# Patient Record
Sex: Female | Born: 1995 | Race: Black or African American | Hispanic: No | Marital: Single | Smoking: Never smoker
Health system: Southern US, Community
[De-identification: ages and names within clinical notes are randomized; demographics above are authoritative.]

## PROBLEM LIST (undated history)

## (undated) DIAGNOSIS — J45909 Unspecified asthma, uncomplicated: Secondary | ICD-10-CM

## (undated) DIAGNOSIS — J302 Other seasonal allergic rhinitis: Secondary | ICD-10-CM

## (undated) DIAGNOSIS — J189 Pneumonia, unspecified organism: Secondary | ICD-10-CM

---
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
## 2014-08-14 ENCOUNTER — Encounter (HOSPITAL_COMMUNITY): Payer: Self-pay | Admitting: Emergency Medicine

## 2014-08-14 ENCOUNTER — Inpatient Hospital Stay (HOSPITAL_COMMUNITY)
Admission: EM | Admit: 2014-08-14 | Discharge: 2014-08-17 | DRG: 194 | Disposition: A | Discharge status: Home / Self Care | Payer: Medicaid Other | Attending: Internal Medicine | Admitting: Internal Medicine

## 2014-08-14 ENCOUNTER — Emergency Department (HOSPITAL_COMMUNITY): Payer: Medicaid Other

## 2014-08-14 DIAGNOSIS — R197 Diarrhea, unspecified: Secondary | ICD-10-CM

## 2014-08-14 DIAGNOSIS — J189 Pneumonia, unspecified organism: Principal | ICD-10-CM | POA: Diagnosis present

## 2014-08-14 DIAGNOSIS — J45901 Unspecified asthma with (acute) exacerbation: Secondary | ICD-10-CM | POA: Diagnosis present

## 2014-08-14 DIAGNOSIS — J45902 Unspecified asthma with status asthmaticus: Secondary | ICD-10-CM | POA: Insufficient documentation

## 2014-08-14 DIAGNOSIS — R062 Wheezing: Secondary | ICD-10-CM | POA: Diagnosis not present

## 2014-08-14 DIAGNOSIS — J302 Other seasonal allergic rhinitis: Secondary | ICD-10-CM | POA: Diagnosis present

## 2014-08-14 DIAGNOSIS — J45909 Unspecified asthma, uncomplicated: Secondary | ICD-10-CM | POA: Insufficient documentation

## 2014-08-14 DIAGNOSIS — A084 Viral intestinal infection, unspecified: Secondary | ICD-10-CM | POA: Diagnosis present

## 2014-08-14 DIAGNOSIS — F1290 Cannabis use, unspecified, uncomplicated: Secondary | ICD-10-CM | POA: Diagnosis present

## 2014-08-14 DIAGNOSIS — J4542 Moderate persistent asthma with status asthmaticus: Secondary | ICD-10-CM

## 2014-08-14 DIAGNOSIS — J4531 Mild persistent asthma with (acute) exacerbation: Secondary | ICD-10-CM

## 2014-08-14 DIAGNOSIS — R112 Nausea with vomiting, unspecified: Secondary | ICD-10-CM | POA: Diagnosis present

## 2014-08-14 HISTORY — DX: Other seasonal allergic rhinitis: J30.2

## 2014-08-14 LAB — CBC
HCT: 45.9 % (ref 36.0–46.0)
HEMOGLOBIN: 15.5 g/dL — AB (ref 12.0–15.0)
MCH: 25.4 pg — AB (ref 26.0–34.0)
MCHC: 33.8 g/dL (ref 30.0–36.0)
MCV: 75.1 fL — ABNORMAL LOW (ref 78.0–100.0)
Platelets: 362 10*3/uL (ref 150–400)
RBC: 6.11 MIL/uL — AB (ref 3.87–5.11)
RDW: 14.2 % (ref 11.5–15.5)
WBC: 14.2 10*3/uL — AB (ref 4.0–10.5)

## 2014-08-14 LAB — I-STAT CHEM 8, ED
BUN: 14 mg/dL (ref 6–23)
CREATININE: 0.8 mg/dL (ref 0.50–1.10)
Calcium, Ion: 1.26 mmol/L — ABNORMAL HIGH (ref 1.12–1.23)
Chloride: 107 mEq/L (ref 96–112)
Glucose, Bld: 101 mg/dL — ABNORMAL HIGH (ref 70–99)
HEMATOCRIT: 53 % — AB (ref 36.0–46.0)
HEMOGLOBIN: 18 g/dL — AB (ref 12.0–15.0)
POTASSIUM: 4.5 meq/L (ref 3.7–5.3)
SODIUM: 142 meq/L (ref 137–147)
TCO2: 17 mmol/L (ref 0–100)

## 2014-08-14 MED ORDER — ALBUTEROL (5 MG/ML) CONTINUOUS INHALATION SOLN
15.0000 mg/h | INHALATION_SOLUTION | Freq: Once | RESPIRATORY_TRACT | Status: AC
  Administered 2014-08-14: 15 mg/h via RESPIRATORY_TRACT
  Filled 2014-08-14: qty 20

## 2014-08-14 MED ORDER — PREDNISONE 20 MG PO TABS
40.0000 mg | ORAL_TABLET | Freq: Once | ORAL | Status: AC
  Administered 2014-08-14: 40 mg via ORAL
  Filled 2014-08-14: qty 2

## 2014-08-14 MED ORDER — DEXTROSE 5 % IV SOLN
1.0000 g | Freq: Once | INTRAVENOUS | Status: AC
  Administered 2014-08-14: 1 g via INTRAVENOUS
  Filled 2014-08-14: qty 10

## 2014-08-14 MED ORDER — IPRATROPIUM-ALBUTEROL 0.5-2.5 (3) MG/3ML IN SOLN
3.0000 mL | Freq: Once | RESPIRATORY_TRACT | Status: AC
  Administered 2014-08-14: 3 mL via RESPIRATORY_TRACT
  Filled 2014-08-14: qty 3

## 2014-08-14 MED ORDER — DEXTROSE 5 % IV SOLN
500.0000 mg | Freq: Once | INTRAVENOUS | Status: AC
  Administered 2014-08-15: 500 mg via INTRAVENOUS
  Filled 2014-08-14: qty 500

## 2014-08-14 NOTE — ED Notes (Signed)
Pt presents with difficulty breathing, short sentences, accessory muscle usage noted, pt states she has used her MDI "a lot" today, out of medication for nebulizer.

## 2014-08-14 NOTE — ED Provider Notes (Signed)
CSN: [id number]     Arrival date & time 08/14/14  2123 History   First MD Initiated Contact with Patient 08/14/14 2140     Chief Complaint  Patient presents with  . Asthma     (Consider location/radiation/quality/duration/timing/severity/associated sxs/prior Treatment) HPI Patient developed wheezing gradual in onset progressively worsening onset 2 days ago accompanied by cough productive of yellow sputum. She denies fever she admits to vomiting 2 or 3 times. one time was posttussive denies nausea presently denies pain anywhere. She ran out of her albuterol inhaler and nebulizer solution. No treatment prior to coming here. She feels slightly improved since treatment with a DuoNeb prior to my exam Past Medical History  Diagnosis Date  . Asthma   . Seasonal allergies    no history of intubation. Positive history of steroid use, one ED visit in the past year for asthma History reviewed. No pertinent past surgical history. No family history on file. History  Substance Use Topics  . Smoking status: Never Smoker   . Smokeless tobacco: Never Used  . Alcohol Use: Yes   positive smoker positive marijuana use no alcohol use OB History    No data available     Review of Systems  Constitutional: Negative.   HENT: Negative.   Respiratory: Positive for cough, shortness of breath and wheezing.   Cardiovascular: Negative.   Gastrointestinal: Positive for vomiting.  Musculoskeletal: Negative.   Skin: Negative.   Neurological: Negative.   Psychiatric/Behavioral: Negative.   All other systems reviewed and are negative.     Allergies  Review of patient's allergies indicates no known allergies.  Home Medications   Prior to Admission medications   Medication Sig Start Date End Date Taking? Authorizing Provider  albuterol (PROVENTIL HFA;VENTOLIN HFA) 108 (90 BASE) MCG/ACT inhaler Inhale 2 puffs into the lungs every 6 (six) hours as needed for wheezing. 02/25/13   Osvaldo ShipperGokul Krishnan, MD   albuterol (PROVENTIL) (2.5 MG/3ML) 0.083% nebulizer solution Take 3 mLs (2.5 mg total) by nebulization every 6 (six) hours as needed for wheezing. 02/25/13   Osvaldo ShipperGokul Krishnan, MD  medroxyPROGESTERone (DEPO-PROVERA) 150 MG/ML injection Inject 150 mg into the muscle every 3 (three) months.    Historical Provider, MD  montelukast (SINGULAIR) 10 MG tablet Take 10 mg by mouth at bedtime.    Historical Provider, MD  PRESCRIPTION MEDICATION Take 1 tablet by mouth daily. Allergy medication    Historical Provider, MD   BP 130/73 mmHg  Pulse 135  Temp(Src) 98.3 F (36.8 C) (Oral)  Resp 20  Ht 5\' 3"  (1.6 m)  Wt 127 lb (57.607 kg)  BMI 22.50 kg/m2  SpO2 88%  LMP 08/05/2014 Physical Exam  Constitutional: She appears well-developed and well-nourished. No distress.  HENT:  Head: Normocephalic and atraumatic.  Eyes: Conjunctivae are normal. Pupils are equal, round, and reactive to light.  Neck: Neck supple. No tracheal deviation present. No thyromegaly present.  Cardiovascular: Normal rate.   No murmur heard. Tachycardic  Pulmonary/Chest: She has no wheezes.  Speaks in paragraphs prolonged history phase with expiratory wheezes  Abdominal: Soft. Bowel sounds are normal. She exhibits no distension. There is no tenderness.  Musculoskeletal: Normal range of motion. She exhibits no edema or tenderness.  Neurological: She is alert. Coordination normal.  Skin: Skin is warm and dry. No rash noted.  Psychiatric: She has a normal mood and affect.  Nursing note and vitals reviewed.   ED Course  Procedures (including critical care time) Labs Review Labs Reviewed  CBC  I-STAT  CHEM 8, ED    Imaging Review No results found.   EKG Interpretation None     11:25 PM patient's breathing is not at baseline after one hour of continuous nebulization. On reexamination she speaks in paragraphs lungs with extra wheezes and prolonged expert phase Chest x-ray viewed by me Pulse oximetry on room air 88%  consistent with hypoxia MDM  In light of productive cough, leukocytosis we'll treat for community-acquired pneumonia Plan IV antibiotics, additional nebulized treatments. Supplemental oxygen . Spoke with Dr.Niu. Plan 23 hour observation telemetry Diagnosis #1 status asthmaticus #2 community acquired pneumonia #3 hypoxia CRITICAL CARE Performed by: Doug SouJACUBOWITZ,Annah Jasko Total critical care time: 30 minute Critical care time was exclusive of separately billable procedures and treating other patients. Critical care was necessary to treat or prevent imminent or life-threatening deterioration. Critical care was time spent personally by me on the following activities: development of treatment plan with patient and/or surrogate as well as nursing, discussions with consultants, evaluation of patient's response to treatment, examination of patient, obtaining history from patient or surrogate, ordering and performing treatments and interventions, ordering and review of laboratory studies, ordering and review of radiographic studies, pulse oximetry and re-evaluation of patient's condition. Final diagnoses:  Wheeze        Doug SouSam Olia Hinderliter, MD 08/14/14 [phone]

## 2014-08-14 NOTE — H&P (Signed)
[facility] History and Physical  Carita Tamera Reasonan Luster ZOX:[id number] DOB: 04/28/1996 DOA: 08/14/2014  Referring physician: ED physician PCP: [facility]  Specialists:   Chief Complaint: Cough, shortness of breath, and mild nausea, vomiting and diarrhea  HPI: Danielle Carlson is a 18 y.o. female with past medical history of asthma, who presents with cough, shortness of breath, and mild nausea, vomiting and diarrhea  Patient reports that in the past 3 days, she has been having cough, shortness of breath and white-yellow colored sputum production. No fever, chills. Mild chest pain which is related to coughing. No cold symptoms, such as runny nose, sore throat.   Patient also reports mild nausea, vomiting and diarrhea, no abdominal pain in the past 2 days. She denies fever, chills, chest pain, dysuria, urgency, frequency, hematuria, skin rashes, joint pain or leg swelling.  Work up in the ED demonstrates leukocytosis with a WBC 14.2. Chest x-ray showed a right basilar infiltration. Patient is admitted to inpatient for further evaluation and treatment.  Review of Systems: As presented in the history of presenting illness, rest negative.  Where does patient live?  At home Can patient participate in ADLs? Yes  Allergy: No Known Allergies  Past Medical History  Diagnosis Date  . Asthma   . Seasonal allergies     History reviewed. No pertinent past surgical history.  Social History:  reports that she has never smoked. She has never used smokeless tobacco. She reports that she drinks alcohol. She reports that she uses illicit drugs (Marijuana).  Family History: No family history on file.   Prior to Admission medications   Medication Sig Start Date End Date Taking? Authorizing Provider  acetaminophen (TYLENOL) 325 MG tablet Take 650 mg by mouth every 6 (six) hours as needed for moderate pain.   Yes Historical Provider, MD  albuterol (PROVENTIL HFA;VENTOLIN HFA)  108 (90 BASE) MCG/ACT inhaler Inhale 2 puffs into the lungs every 6 (six) hours as needed for wheezing. 02/25/13  Yes Osvaldo ShipperGokul Krishnan, MD  albuterol (PROVENTIL) (2.5 MG/3ML) 0.083% nebulizer solution Take 3 mLs (2.5 mg total) by nebulization every 6 (six) hours as needed for wheezing. 02/25/13  Yes Osvaldo ShipperGokul Krishnan, MD  diphenhydrAMINE (BENADRYL) 12.5 MG/5ML elixir Take 25 mg by mouth 4 (four) times daily as needed for allergies.   Yes Historical Provider, MD  montelukast (SINGULAIR) 10 MG tablet Take 10 mg by mouth at bedtime.   Yes Historical Provider, MD    Physical Exam: Filed Vitals:   08/14/14 2334 08/14/14 2345 08/15/14 0000 08/15/14 0520  BP:  126/86 128/83 120/85  Pulse:  136 116 122  Temp:   98.7 F (37.1 C) 98.1 F (36.7 C)  TempSrc:   Oral Oral  Resp:   20 20  Height:   5\' 4"  (1.626 m)   Weight:   51.891 kg (114 lb 6.4 oz)   SpO2: 93% 92% 94% 93%   General: Not in acute distress HEENT:       Eyes: PERRL, EOMI, no scleral icterus       ENT: No discharge from the ears and nose, no pharynx injection, no tonsillar enlargement.        Neck: No JVD, no bruit, no mass felt. Cardiac: S1/S2, RRR, No murmurs, No gallops or rubs Pulm: Bilateral wheezing posteriorly Abd: Soft, nondistended, nontender, no rebound pain, no organomegaly, BS present Ext: No edema bilaterally. 2+DP/PT pulse bilaterally Musculoskeletal: No joint deformities, erythema, or stiffness, ROM full Skin: No rashes.  Neuro: Alert and oriented X3, cranial nerves II-XII grossly intact, muscle strength 5/5 in all extremeties, sensation to light touch intact.  Psych: Patient is not psychotic, no suicidal or hemocidal ideation.  Labs on Admission:  Basic Metabolic Panel:  Recent Labs Lab 08/14/14 2209 08/15/14 0130  NA 142 138  K 4.5 4.1  CL 107 102  CO2  --  14*  GLUCOSE 101* 112*  BUN 14 11  CREATININE 0.80 0.78  CALCIUM  --  9.5   Liver Function Tests:  Recent Labs Lab 08/15/14 0130  AST 27  ALT 13   ALKPHOS 56  BILITOT 0.9  PROT 7.7  ALBUMIN 3.8   No results for input(s): LIPASE, AMYLASE in the last 168 hours. No results for input(s): AMMONIA in the last 168 hours. CBC:  Recent Labs Lab 08/14/14 2156 08/14/14 2209  WBC 14.2*  --   HGB 15.5* 18.0*  HCT 45.9 53.0*  MCV 75.1*  --   PLT 362  --    Cardiac Enzymes: No results for input(s): CKTOTAL, CKMB, CKMBINDEX, TROPONINI in the last 168 hours.  BNP (last 3 results) No results for input(s): PROBNP in the last 8760 hours. CBG: No results for input(s): GLUCAP in the last 168 hours.  Radiological Exams on Admission: Dg Chest Port 1 View  08/14/2014   CLINICAL DATA:  Acute onset of wheezing, cough and congestion for 2-3 days. Shortness of breath, nausea and vomiting. Current history of smoking. Initial encounter.  EXAM: PORTABLE CHEST - 1 VIEW  COMPARISON:  Chest radiograph performed 02/23/2013  FINDINGS: The lungs are well-aerated. Mild chronic peribronchial thickening is noted. Minimal right basilar opacity could reflect mild pneumonia. There is no evidence of pleural effusion or pneumothorax.  The cardiomediastinal silhouette is within normal limits. No acute osseous abnormalities are seen.  IMPRESSION: 1. Minimal right basilar opacity could reflect mild pneumonia. 2. Mild chronic peribronchial thickening noted.   Electronically Signed   By: Roanna RaiderJeffery  Chang M.D.   On: 08/14/2014 22:39    EKG: Will get one   Assessment/Plan Principal Problem:   CAP (community acquired pneumonia) Active Problems:   Asthma with acute exacerbation   Seasonal allergies   Nausea vomiting and diarrhea  CAP and asthma exacerbation: Patient's cough, chest pain, leukocytosis are consistent with comminuted acquired pneumonia. Currently the patient is mildly septic with heart rate of 125 and leukocytosis, wbc 14.2. Patient also has asthma exacerbation given bilateral wheezing on auscultation.  - will admitted to Telemetry Bed - blood culture  x2 - urine legionella and S. pneumococcal antigen - treat with levaquin - follow up sputum culture and her respiratory events panel - IVF: 100 cc/h - treat with DuoNeb and albuterol Nebs - prednisone 40 mg daily - give Zofran for Nausea  Mild nausea, vomiting and diarrhea: Patient does not have abdominal pain. It is most likely due to viral gastro-enteritis. -Check GI pathogen panel -IV fluid -Zofran fornausea   DVT ppx: SQ Heparin     Code Status: Full code Family Communication:    Yes, patient's  mother     at bed side Disposition Plan: Admit to inpatient   Date of Service 08/15/2014    Lorretta HarpIU, Makael Stein [facility] Pager [phone]  If 7PM-7AM, please contact night-coverage [email or web address] Password [email or web address] 08/15/2014, 5:57 AM

## 2014-08-15 DIAGNOSIS — R112 Nausea with vomiting, unspecified: Secondary | ICD-10-CM

## 2014-08-15 DIAGNOSIS — R197 Diarrhea, unspecified: Secondary | ICD-10-CM

## 2014-08-15 LAB — COMPREHENSIVE METABOLIC PANEL
ALK PHOS: 56 U/L (ref 39–117)
ALT: 13 U/L (ref 0–35)
ANION GAP: 22 — AB (ref 5–15)
AST: 27 U/L (ref 0–37)
Albumin: 3.8 g/dL (ref 3.5–5.2)
BUN: 11 mg/dL (ref 6–23)
CALCIUM: 9.5 mg/dL (ref 8.4–10.5)
CO2: 14 meq/L — AB (ref 19–32)
Chloride: 102 mEq/L (ref 96–112)
Creatinine, Ser: 0.78 mg/dL (ref 0.50–1.10)
GLUCOSE: 112 mg/dL — AB (ref 70–99)
Potassium: 4.1 mEq/L (ref 3.7–5.3)
Sodium: 138 mEq/L (ref 137–147)
TOTAL PROTEIN: 7.7 g/dL (ref 6.0–8.3)
Total Bilirubin: 0.9 mg/dL (ref 0.3–1.2)

## 2014-08-15 LAB — HIV ANTIBODY (ROUTINE TESTING W REFLEX): HIV: NONREACTIVE

## 2014-08-15 LAB — EXPECTORATED SPUTUM ASSESSMENT W GRAM STAIN, RFLX TO RESP C

## 2014-08-15 LAB — EXPECTORATED SPUTUM ASSESSMENT W REFEX TO RESP CULTURE

## 2014-08-15 LAB — STREP PNEUMONIAE URINARY ANTIGEN: STREP PNEUMO URINARY ANTIGEN: NEGATIVE

## 2014-08-15 MED ORDER — PREDNISONE 20 MG PO TABS
40.0000 mg | ORAL_TABLET | Freq: Every day | ORAL | Status: DC
  Administered 2014-08-15 – 2014-08-17 (×3): 40 mg via ORAL
  Filled 2014-08-15 (×4): qty 2

## 2014-08-15 MED ORDER — CETYLPYRIDINIUM CHLORIDE 0.05 % MT LIQD
7.0000 mL | Freq: Two times a day (BID) | OROMUCOSAL | Status: DC
  Administered 2014-08-15 (×2): 7 mL via OROMUCOSAL

## 2014-08-15 MED ORDER — CETYLPYRIDINIUM CHLORIDE 0.05 % MT LIQD
7.0000 mL | Freq: Two times a day (BID) | OROMUCOSAL | Status: DC
  Administered 2014-08-15 – 2014-08-17 (×6): 7 mL via OROMUCOSAL

## 2014-08-15 MED ORDER — DIPHENHYDRAMINE HCL 12.5 MG/5ML PO ELIX: 25.0000 mg | ORAL_SOLUTION | Freq: Four times a day (QID) | ORAL | Status: DC | PRN

## 2014-08-15 MED ORDER — INFLUENZA VAC SPLIT QUAD 0.5 ML IM SUSY
0.5000 mL | PREFILLED_SYRINGE | INTRAMUSCULAR | Status: AC
  Administered 2014-08-16: 0.5 mL via INTRAMUSCULAR
  Filled 2014-08-15 (×2): qty 0.5

## 2014-08-15 MED ORDER — ACETAMINOPHEN 325 MG PO TABS
650.0000 mg | ORAL_TABLET | Freq: Four times a day (QID) | ORAL | Status: DC | PRN
  Administered 2014-08-15: 650 mg via ORAL
  Filled 2014-08-15 (×2): qty 2

## 2014-08-15 MED ORDER — IPRATROPIUM-ALBUTEROL 0.5-2.5 (3) MG/3ML IN SOLN
3.0000 mL | RESPIRATORY_TRACT | Status: DC
  Administered 2014-08-15 – 2014-08-17 (×14): 3 mL via RESPIRATORY_TRACT
  Filled 2014-08-15 (×14): qty 3

## 2014-08-15 MED ORDER — ONDANSETRON HCL 4 MG/2ML IJ SOLN
4.0000 mg | Freq: Three times a day (TID) | INTRAMUSCULAR | Status: DC | PRN
  Administered 2014-08-15: 4 mg via INTRAVENOUS
  Filled 2014-08-15: qty 2

## 2014-08-15 MED ORDER — ALBUTEROL (5 MG/ML) CONTINUOUS INHALATION SOLN
2.5000 mg/h | INHALATION_SOLUTION | RESPIRATORY_TRACT | Status: DC | PRN
  Filled 2014-08-15: qty 20

## 2014-08-15 MED ORDER — LEVOFLOXACIN IN D5W 500 MG/100ML IV SOLN
500.0000 mg | INTRAVENOUS | Status: DC
  Administered 2014-08-15 – 2014-08-17 (×3): 500 mg via INTRAVENOUS
  Filled 2014-08-15 (×3): qty 100

## 2014-08-15 MED ORDER — CHLORHEXIDINE GLUCONATE 0.12 % MT SOLN
15.0000 mL | Freq: Two times a day (BID) | OROMUCOSAL | Status: DC
  Administered 2014-08-15 – 2014-08-16 (×4): 15 mL via OROMUCOSAL
  Filled 2014-08-15 (×7): qty 15

## 2014-08-15 MED ORDER — HEPARIN SODIUM (PORCINE) 5000 UNIT/ML IJ SOLN
5000.0000 [IU] | Freq: Three times a day (TID) | INTRAMUSCULAR | Status: DC
  Administered 2014-08-15 – 2014-08-17 (×7): 5000 [IU] via SUBCUTANEOUS
  Filled 2014-08-15 (×10): qty 1

## 2014-08-15 MED ORDER — ALBUTEROL SULFATE (2.5 MG/3ML) 0.083% IN NEBU
5.0000 mg | INHALATION_SOLUTION | RESPIRATORY_TRACT | Status: DC | PRN
Start: 2014-08-15 — End: 2014-08-17

## 2014-08-15 MED ORDER — MONTELUKAST SODIUM 10 MG PO TABS
10.0000 mg | ORAL_TABLET | Freq: Every day | ORAL | Status: DC
  Administered 2014-08-15 – 2014-08-16 (×2): 10 mg via ORAL
  Filled 2014-08-15 (×3): qty 1

## 2014-08-15 NOTE — Progress Notes (Signed)
Progress Note   Danielle Carlson   Brief Narrative:   Danielle Carlson is an 18 y.o. female with a PMH of asthma who was admitted 08/14/14 with community-acquired pneumonia and asthma exacerbation.  Assessment/Plan:   Principal Problem:   CAP (community acquired pneumonia)  Continue Levaquin.  Strep pneumonia negative. Follow-up blood/sputum cultures, Legionella antigen, respiratory virus panel and HIV antibody.  Active Problems:   Asthma with acute exacerbation  Continue nebulized bronchodilator therapy, prednisone.    Seasonal allergies  Continue Singulair and as needed Benadryl.    Nausea vomiting and diarrhea  Follow-up GI pathogen panel.    DVT Prophylaxis  Continue subcutaneous heparin.  Code Status: Full. Family Communication: No family at the bedside. Disposition Plan: Home when stable.   IV Access:    Peripheral IV   Procedures and diagnostic studies:   Dg Chest Port 1 View 08/14/2014: 1. Minimal right basilar opacity could reflect mild pneumonia. 2. Mild chronic peribronchial thickening noted.    Medical Consultants:    None.  Anti-Infectives:    Rocephin/Azithromycin x 1 08/14/14  Levaquin 08/14/14--->  Subjective:   Danielle Carlson is beginning to feel a bit better.  Still with cough, white/yellow sputum.  Still dyspneic.    Objective:    Filed Vitals:   08/14/14 2334 08/14/14 2345 08/15/14 0000 08/15/14 0520  BP:  126/86 128/83 120/85  Pulse:  136 116 122  Temp:   98.7 F (37.1 C) 98.1 F (36.7 C)  TempSrc:   Oral Oral  Resp:   20 20  Height:   5\' 4"  (1.626 m)   Weight:   51.891 kg (114 lb 6.4 oz)   SpO2: 93% 92% 94% 93%    Intake/Output Summary (Last 24 hours) at 08/15/14 0740 Last data filed at 08/15/14 0234  Gross per 24 hour  Intake    470 ml  Output      0 ml  Net    470 ml    Exam: Gen:  NAD Cardiovascular:  RRR,  No M/R/G Respiratory:  Lungs with expiratory wheezes Gastrointestinal:  Abdomen soft, NT/ND, + BS Extremities:  No C/E/C   Data Reviewed:    Labs: Basic Metabolic Panel:  Recent Labs Lab 08/14/14 2209 08/15/14 0130  NA 142 138  K 4.5 4.1  CL 107 102  CO2  --  14*  GLUCOSE 101* 112*  BUN 14 11  CREATININE 0.80 0.78  CALCIUM  --  9.5   GFR Estimated Creatinine Clearance: 92.7 mL/min (by C-G formula based on Cr of 0.78). Liver Function Tests:  Recent Labs Lab 08/15/14 0130  AST 27  ALT 13  ALKPHOS 56  BILITOT 0.9  PROT 7.7  ALBUMIN 3.8   CBC:  Recent Labs Lab 08/14/14 2156 08/14/14 2209  WBC 14.2*  --   HGB 15.5* 18.0*  HCT 45.9 53.0*  MCV 75.1*  --   PLT 362  --    Sepsis Labs:  Recent Labs Lab 08/14/14 2156  WBC 14.2*   Microbiology Recent Results (from the past 240 hour(s))  Culture, sputum-assessment     Status: None   Collection Time: 08/15/14  1:50 AM  Result Value Ref Range Status   Specimen Description SPUTUM  Final   Special Requests NONE  Final   Sputum evaluation   Final    MICROSCOPIC FINDINGS SUGGEST THAT THIS SPECIMEN IS NOT REPRESENTATIVE OF LOWER RESPIRATORY  SECRETIONS. PLEASE RECOLLECT. SPOKE WITH [facility] HospitalKALLAM,M RN [phone] [place],N    Report Status 08/15/2014 FINAL  Final     Medications:   . antiseptic oral rinse  7 mL Mouth Rinse BID  . antiseptic oral rinse  7 mL Mouth Rinse q12n4p  . chlorhexidine  15 mL Mouth Rinse BID  . heparin  5,000 Units Subcutaneous 3 times per day  . [START ON 08/16/2014] Influenza vac split quadrivalent PF  0.5 mL Intramuscular Tomorrow-1000  . ipratropium-albuterol  3 mL Nebulization Q4H  . levofloxacin (LEVAQUIN) IV  500 mg Intravenous Q24H  . montelukast  10 mg Oral QHS  . predniSONE  40 mg Oral Q breakfast   Continuous Infusions:   Time spent: 25 minutes.   LOS: 1 day   RAMA,CHRISTINA  [facility] Pager [phone]. If unable to reach me by pager, please call my cell phone  at [phone].  *Please refer to [email or web address], password [email or web address] to get updated schedule on who will round on this patient, as hospitalists switch teams weekly. If 7PM-7AM, please contact night-coverage at [email or web address], password [email or web address] for any overnight needs.  08/15/2014, 7:40 AM

## 2014-08-16 LAB — LEGIONELLA ANTIGEN, URINE

## 2014-08-16 LAB — CBC
HCT: 38.2 % (ref 36.0–46.0)
HEMOGLOBIN: 12.3 g/dL (ref 12.0–15.0)
MCH: 24.4 pg — AB (ref 26.0–34.0)
MCHC: 32.2 g/dL (ref 30.0–36.0)
MCV: 75.6 fL — AB (ref 78.0–100.0)
PLATELETS: 288 10*3/uL (ref 150–400)
RBC: 5.05 MIL/uL (ref 3.87–5.11)
RDW: 14.4 % (ref 11.5–15.5)
WBC: 15.9 10*3/uL — AB (ref 4.0–10.5)

## 2014-08-16 LAB — RESPIRATORY VIRUS PANEL
ADENOVIRUS: NOT DETECTED
Influenza A H1: NOT DETECTED
Influenza A H3: NOT DETECTED
Influenza A: NOT DETECTED
Influenza B: NOT DETECTED
METAPNEUMOVIRUS: NOT DETECTED
PARAINFLUENZA 2 A: NOT DETECTED
Parainfluenza 1: NOT DETECTED
Parainfluenza 3: NOT DETECTED
RESPIRATORY SYNCYTIAL VIRUS B: NOT DETECTED
Respiratory Syncytial Virus A: NOT DETECTED
Rhinovirus: NOT DETECTED

## 2014-08-16 LAB — BASIC METABOLIC PANEL
Anion gap: 10 (ref 5–15)
BUN: 11 mg/dL (ref 6–23)
CHLORIDE: 107 meq/L (ref 96–112)
CO2: 22 mmol/L (ref 19–32)
Calcium: 9.4 mg/dL (ref 8.4–10.5)
Creatinine, Ser: 0.76 mg/dL (ref 0.50–1.10)
GFR calc Af Amer: >90 mL/min (ref >90)
GFR calc non Af Amer: >90 mL/min (ref >90)
GLUCOSE: 91 mg/dL (ref 70–99)
POTASSIUM: 4.3 mmol/L (ref 3.5–5.1)
Sodium: 139 mmol/L (ref 135–145)

## 2014-08-16 LAB — GI PATHOGEN PANEL BY PCR, STOOL
C difficile toxin A/B: NEGATIVE
CAMPYLOBACTER BY PCR: NEGATIVE
Cryptosporidium by PCR: NEGATIVE
E COLI (STEC): NEGATIVE
E coli (ETEC) LT/ST: NEGATIVE
E coli 0157 by PCR: NEGATIVE
G lamblia by PCR: NEGATIVE
NOROVIRUS G1/G2: NEGATIVE
ROTAVIRUS A BY PCR: NEGATIVE
SALMONELLA BY PCR: NEGATIVE
SHIGELLA BY PCR: NEGATIVE

## 2014-08-16 NOTE — Progress Notes (Signed)
Progress Note   Danielle Carlson GEX:[id number] DOB: 30-Mar-1996 DOA: 08/14/2014 PCP: [facility]   Brief Narrative:   Danielle Carlson is an 18 y.o. female with a PMH of asthma who was admitted 08/14/14 with community-acquired pneumonia and asthma exacerbation.  Assessment/Plan:   Principal Problem:   CAP (community acquired pneumonia)  Continue Levaquin.  Strep pneumonia negative. HIV nonreactive. Respiratory virus panel negative.   Follow-up blood/sputum cultures, Legionella antigen.  Active Problems:   Asthma with acute exacerbation  Continue nebulized bronchodilator therapy, prednisone.    Seasonal allergies  Continue Singulair and as needed Benadryl.    Nausea vomiting and diarrhea  Follow-up GI pathogen panel.    DVT Prophylaxis  Continue subcutaneous heparin.  Code Status: Full. Family Communication: No family at the bedside. Disposition Plan: Home when stable.   IV Access:    Peripheral IV   Procedures and diagnostic studies:   Dg Chest Port 1 View 08/14/2014: 1. Minimal right basilar opacity could reflect mild pneumonia. 2. Mild chronic peribronchial thickening noted.    Medical Consultants:    None.  Anti-Infectives:    Rocephin/Azithromycin x 1 08/14/14  Levaquin 08/14/14--->  Subjective:   Danielle Carlson denies fever/chills.  Feels a bit better.  Has had some myalgias.  No nausea, vomiting or diarrhea.  Cough still productive of yellow sputum.  Objective:    Filed Vitals:   08/15/14 1500 08/15/14 1613 08/15/14 2339 08/16/14 0601  BP: 118/71  113/75 103/69  Pulse: 107  98 83  Temp: 98.7 F (37.1 C)  99 F (37.2 C) 98.1 F (36.7 C)  TempSrc: Oral  Oral Oral  Resp: _0 Height:      Weight:      SpO2: 98% 96% 97% 99%    Intake/Output Summary (Last 24 hours) at 08/16/14 0801 Last data filed at 08/15/14 1900  Gross per 24 hour  Intake    360 ml  Output      0 ml  Net    360 ml     Exam: Gen:  NAD Cardiovascular:  RRR, No M/R/G Respiratory:  Lungs with decreased wheezing Gastrointestinal:  Abdomen soft, NT/ND, + BS Extremities:  No C/E/C   Data Reviewed:    Labs: Basic Metabolic Panel:  Recent Labs Lab 08/14/14 2209 08/15/14 0130 08/16/14 0428  NA 142 138 139  K 4.5 4.1 4.3  CL 107 102 107  CO2  --  14* 22  GLUCOSE 101* 112* 91  BUN _1 CREATININE 0.80 0.78 0.76  CALCIUM  --  9.5 9.4   GFR Estimated Creatinine Clearance: 92.7 mL/min (by C-G formula based on Cr of 0.76). Liver Function Tests:  Recent Labs Lab 08/15/14 0130  AST 27  ALT 13  ALKPHOS 56  BILITOT 0.9  PROT 7.7  ALBUMIN 3.8   CBC:  Recent Labs Lab 08/14/14 2156 08/14/14 2209 08/16/14 0428  WBC 14.2*  --  15.9*  HGB 15.5* 18.0* 12.3  HCT 45.9 53.0* 38.2  MCV 75.1*  --  75.6*  PLT 362  --  288   Sepsis Labs:  Recent Labs Lab 08/14/14 2156 08/16/14 0428  WBC 14.2* 15.9*   Microbiology Recent Results (from the past 240 hour(s))  Respiratory virus panel     Status: None   Collection Time: 08/15/14  1:00 AM  Result Value Ref Range Status   Source - RVPAN NOSE  Final   Respiratory  Syncytial Virus A NOT DETECTED  Final   Respiratory Syncytial Virus B NOT DETECTED  Final   Influenza A NOT DETECTED  Final   Influenza B NOT DETECTED  Final   Parainfluenza 1 NOT DETECTED  Final   Parainfluenza 2 NOT DETECTED  Final   Parainfluenza 3 NOT DETECTED  Final   Metapneumovirus NOT DETECTED  Final   Rhinovirus NOT DETECTED  Final   Adenovirus NOT DETECTED  Final   Influenza A H1 NOT DETECTED  Final   Influenza A H3 NOT DETECTED  Final    Comment: (NOTE)       Normal Reference Range for each Analyte: NOT DETECTED Testing performed using the Luminex xTAG Respiratory Viral Panel test kit. The analytical performance characteristics of this assay have been determined by [employer].  The modifications have not been cleared or approved by the FDA. This  assay has been validated pursuant to the CLIA regulations and is used for clinical purposes. Performed at [facility], sputum-assessment     Status: None   Collection Time: 08/15/14  1:50 AM  Result Value Ref Range Status   Specimen Description SPUTUM  Final   Special Requests NONE  Final   Sputum evaluation   Final    MICROSCOPIC FINDINGS SUGGEST THAT THIS SPECIMEN IS NOT REPRESENTATIVE OF LOWER RESPIRATORY SECRETIONS. PLEASE RECOLLECT. SPOKE WITH [facility] RN [phone] [place],N    Report Status 08/15/2014 FINAL  Final  Culture, expectorated sputum-assessment     Status: None   Collection Time: 08/15/14  9:25 AM  Result Value Ref Range Status   Specimen Description SPUTUM  Final   Special Requests NONE  Final   Sputum evaluation   Final    THIS SPECIMEN IS ACCEPTABLE. RESPIRATORY CULTURE REPORT TO FOLLOW.   Report Status 08/15/2014 FINAL  Final     Medications:   . antiseptic oral rinse  7 mL Mouth Rinse BID  . antiseptic oral rinse  7 mL Mouth Rinse q12n4p  . chlorhexidine  15 mL Mouth Rinse BID  . heparin  5,000 Units Subcutaneous 3 times per day  . Influenza vac split quadrivalent PF  0.5 mL Intramuscular Tomorrow-1000  . ipratropium-albuterol  3 mL Nebulization Q4H  . levofloxacin (LEVAQUIN) IV  500 mg Intravenous Q24H  . montelukast  10 mg Oral QHS  . predniSONE  40 mg Oral Q breakfast   Continuous Infusions:   Time spent: 25 minutes.   LOS: 2 days   Danielle Carlson  [facility] Pager [phone]. If unable to reach me by pager, please call my cell phone at [phone].  *Please refer to [email or web address], password [email or web address] to get updated schedule on who will round on this patient, as hospitalists switch teams weekly. If 7PM-7AM, please contact night-coverage at [email or web address], password [email or web address] for any overnight needs.  08/16/2014, 8:01 AM

## 2014-08-16 NOTE — Progress Notes (Signed)
CARE MANAGEMENT NOTE 08/16/2014  Patient:  Perry MountKAI,Janyce NAN   Account Number:  [id number]  Date Initiated:  08/16/2014  Documentation initiated by:  Trinna BalloonMcGIBBONEY,COOKIE Marily Konczal  Subjective/Objective Assessment:   pt admitted with Asthma, SOB and PNA     Action/Plan:   from home   Anticipated DC Date:  08/19/2014   Anticipated DC Plan:  HOME/SELF CARE         Choice offered to / List presented to:             Status of service:  In process, will continue to follow Medicare Important Message given?   (If response is "NO", the following Medicare IM given date fields will be blank) Date Medicare IM given:   Medicare IM given by:   Date Additional Medicare IM given:   Additional Medicare IM given by:    Discharge Disposition:    Per UR Regulation:  Reviewed for med. necessity/level of care/duration of stay  If discussed at Long Length of Stay Meetings, dates discussed:    Comments:  08/16/14 MMcGibboney, RN, BSN Chart reviewed. Will follow for discharge needs.

## 2014-08-17 LAB — CULTURE, RESPIRATORY W GRAM STAIN: Culture: NORMAL

## 2014-08-17 MED ORDER — ALBUTEROL SULFATE HFA 108 (90 BASE) MCG/ACT IN AERS: 2.0000 | INHALATION_SPRAY | Freq: Four times a day (QID) | RESPIRATORY_TRACT | Status: DC | PRN

## 2014-08-17 MED ORDER — ALBUTEROL SULFATE (2.5 MG/3ML) 0.083% IN NEBU: 2.5000 mg | INHALATION_SOLUTION | Freq: Four times a day (QID) | RESPIRATORY_TRACT | Status: DC | PRN

## 2014-08-17 MED ORDER — PREDNISONE 10 MG PO TABS: 40.0000 mg | ORAL_TABLET | Freq: Every day | ORAL | Status: DC

## 2014-08-17 MED ORDER — LEVOFLOXACIN 500 MG PO TABS: 500.0000 mg | ORAL_TABLET | Freq: Every day | ORAL | Status: DC

## 2014-08-17 NOTE — Discharge Summary (Addendum)
Physician Discharge Summary  Danielle Carlson Danielle Carlson JXB:[id number] DOB: 09/23/1995 DOA: 08/14/2014  PCP: [facility]  Admit date: 08/14/2014 Discharge date: 08/17/2014  Recommendations for Outpatient Follow-up:  1. Take Levaquin for 7 days on discharge. 2. Take prednisone, start from 40 mg a day and taper down daily by 10 mg, down to 0 mg and then stop.  Discharge Diagnoses:  Principal Problem:   CAP (community acquired pneumonia) Active Problems:   Asthma with acute exacerbation   Seasonal allergies   Nausea vomiting and diarrhea    Discharge Condition: stable   Diet recommendation: as tolerated   History of present illness:  18 y.o. female with a PMH of asthma who was admitted 08/14/14 with community-acquired pneumonia and asthma exacerbation.  Assessment/Plan:   Principal Problem:  CAP (community acquired pneumonia) / leukocytosis  Continue Levaquin for 7 days on discharge as prescribed.  Strep pneumonia negative. HIV nonreactive. Respiratory virus panel negative.   Blood cultures to date are negative. Sputum culture is not a good representative for analysis. Legionella is negative.  Since patient is feeling better and wants to go home we will not recollect sputum culture but we will treat empirically for 7 additional days with Levaquin  Active Problems:  Asthma with acute exacerbation  Continue nebulized bronchodilator therapy, prednisone.  Please note prednisone taper prescription provided, starting from 40 mg a day, tapering down by 10 mg a day down to 0. Prescription for nebulizer treatment and bronchodilator inhalers provided.   Seasonal allergies  Continue Singulair and as needed Benadryl.   Nausea vomiting and diarrhea.   Patient feels better. GI pathogen panel normal.    DVT Prophylaxis  Continue subcutaneous heparin while patient is in hospital.   Code Status: Full. Family Communication: No family at the  bedside.    IV Access:    Peripheral IV   Procedures and diagnostic studies:   Dg Chest Port 1 View 08/14/2014: 1. Minimal right basilar opacity could reflect mild pneumonia. 2. Mild chronic peribronchial thickening noted.   Medical Consultants:    None.  Anti-Infectives:    Rocephin/Azithromycin x 1 08/14/14  Levaquin 08/14/14---> for 7 days on discharge as prescribed.   SignedManson Passey:  Kasey Hansell, MD  [facility] 08/17/2014, 10:15 AM  Pager #: [phone]   Discharge Exam: Filed Vitals:   08/17/14 0525  BP: 106/70  Pulse: 82  Temp: 98.3 F (36.8 C)  Resp: 16   Filed Vitals:   08/16/14 2306 08/17/14 0321 08/17/14 0525 08/17/14 0806  BP:   106/70   Pulse:   82   Temp:   98.3 F (36.8 C)   TempSrc:   Oral   Resp:   16   Height:      Weight:      SpO2: 97% 98% 99% 98%    General: Pt is alert, follows commands appropriately, not in acute distress Cardiovascular: Regular rate and rhythm, S1/S2 +, no murmurs Respiratory: Clear to auscultation bilaterally, no wheezing, no crackles, no rhonchi Abdominal: Soft, non tender, non distended, bowel sounds +, no guarding Extremities: no edema, no cyanosis, pulses palpable bilaterally DP and PT Neuro: Grossly nonfocal  Discharge Instructions  Discharge Instructions    Call MD for:  difficulty breathing, headache or visual disturbances    Complete by:  As directed      Call MD for:  persistant dizziness or light-headedness    Complete by:  As directed      Call MD for:  persistant  nausea and vomiting    Complete by:  As directed      Call MD for:  redness, tenderness, or signs of infection (pain, swelling, redness, odor or green/yellow discharge around incision site)    Complete by:  As directed      Call MD for:  severe uncontrolled pain    Complete by:  As directed      Diet - low sodium heart healthy    Complete by:  As directed      Discharge instructions    Complete by:  As  directed   1. Take Levaquin for 7 days on discharge. 2. Take prednisone, start from 40 mg a day and taper down daily by 10 mg, down to 0 mg and then stop.     Increase activity slowly    Complete by:  As directed             Medication List    TAKE these medications        acetaminophen 325 MG tablet  Commonly known as:  TYLENOL  Take 650 mg by mouth every 6 (six) hours as needed for moderate pain.     albuterol 108 (90 BASE) MCG/ACT inhaler  Commonly known as:  PROVENTIL HFA;VENTOLIN HFA  Inhale 2 puffs into the lungs every 6 (six) hours as needed for wheezing.     albuterol (2.5 MG/3ML) 0.083% nebulizer solution  Commonly known as:  PROVENTIL  Take 3 mLs (2.5 mg total) by nebulization every 6 (six) hours as needed for wheezing.     diphenhydrAMINE 12.5 MG/5ML elixir  Commonly known as:  BENADRYL  Take 25 mg by mouth 4 (four) times daily as needed for allergies.     levofloxacin 500 MG tablet  Commonly known as:  LEVAQUIN  Take 1 tablet (500 mg total) by mouth daily.     montelukast 10 MG tablet  Commonly known as:  SINGULAIR  Take 10 mg by mouth at bedtime.     predniSONE 10 MG tablet  Commonly known as:  DELTASONE  Take 4 tablets (40 mg total) by mouth daily with breakfast.           Follow-up Information    Follow up with [facility] Schedule an appointment as soon as possible for a visit in 2 weeks.   Why:  Follow up appt after recent hospitalization   Contact information:   [address] [place] [address] [phone]        The results of significant diagnostics from this hospitalization (including imaging, microbiology, ancillary and laboratory) are listed below for reference.    Significant Diagnostic Studies: Dg Chest Port 1 View  08/14/2014   CLINICAL DATA:  Acute onset of wheezing, cough and congestion for 2-3 days. Shortness of breath, nausea and vomiting. Current history of smoking. Initial encounter.   EXAM: PORTABLE CHEST - 1 VIEW  COMPARISON:  Chest radiograph performed 02/23/2013  FINDINGS: The lungs are well-aerated. Mild chronic peribronchial thickening is noted. Minimal right basilar opacity could reflect mild pneumonia. There is no evidence of pleural effusion or pneumothorax.  The cardiomediastinal silhouette is within normal limits. No acute osseous abnormalities are seen.  IMPRESSION: 1. Minimal right basilar opacity could reflect mild pneumonia. 2. Mild chronic peribronchial thickening noted.   Electronically Signed   By: Roanna Raider M.D.   On: 08/14/2014 22:39    Microbiology: Recent Results (from the past 240 hour(s))  Respiratory virus panel  Status: None   Collection Time: 08/15/14  1:00 AM  Result Value Ref Range Status   Source - RVPAN NOSE  Final   Respiratory Syncytial Virus A NOT DETECTED  Final   Respiratory Syncytial Virus B NOT DETECTED  Final   Influenza A NOT DETECTED  Final   Influenza B NOT DETECTED  Final   Parainfluenza 1 NOT DETECTED  Final   Parainfluenza 2 NOT DETECTED  Final   Parainfluenza 3 NOT DETECTED  Final   Metapneumovirus NOT DETECTED  Final   Rhinovirus NOT DETECTED  Final   Adenovirus NOT DETECTED  Final   Influenza A H1 NOT DETECTED  Final   Influenza A H3 NOT DETECTED  Final  Culture, blood (routine x 2) Call MD if unable to obtain prior to antibiotics being given     Status: None (Preliminary result)   Collection Time: 08/15/14  1:10 AM  Result Value Ref Range Status   Specimen Description BLOOD L ARM  Final   Special Requests BOTTLES DRAWN AEROBIC AND ANAEROBIC 10CC  Final   Culture  Setup Time   Final   Culture   Final           BLOOD CULTURE RECEIVED NO GROWTH TO DATE    Report Status PENDING  Incomplete  Culture, blood (routine x 2) Call MD if unable to obtain prior to antibiotics being given     Status: None (Preliminary result)   Collection Time: 08/15/14  1:30 AM  Result Value Ref Range Status   Specimen Description BLOOD  L HAND  Final   Special Requests BOTTLES DRAWN AEROBIC ONLY 5CC  Final   Culture  Setup Time   Final   Culture   Final           BLOOD CULTURE RECEIVED NO GROWTH TO DATE    Report Status PENDING  Incomplete  Culture, sputum-assessment     Status: None   Collection Time: 08/15/14  1:50 AM  Result Value Ref Range Status   Specimen Description SPUTUM  Final   Special Requests NONE  Final   Sputum evaluation   Final    MICROSCOPIC FINDINGS SUGGEST THAT THIS SPECIMEN IS NOT REPRESENTATIVE OF LOWER RESPIRATORY SECRETIONS. PLEASE RECOLLECT.   Report Status 08/15/2014 FINAL  Final  Culture, expectorated sputum-assessment     Status: None   Collection Time: 08/15/14  9:25 AM  Result Value Ref Range Status   Specimen Description SPUTUM  Final   Special Requests NONE  Final   Sputum evaluation   Final    THIS SPECIMEN IS ACCEPTABLE. RESPIRATORY CULTURE REPORT TO FOLLOW.   Report Status 08/15/2014 FINAL  Final  Culture, respiratory (NON-Expectorated)     Status: None   Collection Time: 08/15/14  9:25 AM  Result Value Ref Range Status   Specimen Description SPUTUM  Final   Special Requests NONE  Final   Gram Stain   Final    FEW WBC PRESENT,BOTH PMN AND MONONUCLEAR RARE SQUAMOUS EPITHELIAL CELLS PRESENT NO ORGANISMS SEEN   Culture   Final    NORMAL OROPHARYNGEAL FLORA Performed at [facility]    Report Status 08/17/2014 FINAL  Final     Labs: Basic Metabolic Panel:  Recent Labs Lab 08/14/14 2209 08/15/14 0130 08/16/14 0428  NA 142 138 139  K 4.5 4.1 4.3  CL 107 102 107  CO2  --  14* 22  GLUCOSE 101* 112* 91  BUN 14 11 11   CREATININE 0.80 0.78  0.76  CALCIUM  --  9.5 9.4   Liver Function Tests:  Recent Labs Lab 08/15/14 0130  AST 27  ALT 13  ALKPHOS 56  BILITOT 0.9  PROT 7.7  ALBUMIN 3.8   No results for input(s): LIPASE, AMYLASE in the last 168 hours. No results for input(s): AMMONIA in the last 168 hours. CBC:  Recent Labs Lab 08/14/14 2156  08/14/14 2209 08/16/14 0428  WBC 14.2*  --  15.9*  HGB 15.5* 18.0* 12.3  HCT 45.9 53.0* 38.2  MCV 75.1*  --  75.6*  PLT 362  --  288   Cardiac Enzymes: No results for input(s): CKTOTAL, CKMB, CKMBINDEX, TROPONINI in the last 168 hours. BNP: BNP (last 3 results) No results for input(s): PROBNP in the last 8760 hours. CBG: No results for input(s): GLUCAP in the last 168 hours.  Time coordinating discharge: Over 30 minutes

## 2014-08-17 NOTE — Discharge Instructions (Signed)

## 2014-08-21 LAB — CULTURE, BLOOD (ROUTINE X 2)
Culture: NO GROWTH
Culture: NO GROWTH

## 2014-12-04 ENCOUNTER — Encounter (HOSPITAL_COMMUNITY): Payer: Self-pay | Admitting: Emergency Medicine

## 2014-12-04 ENCOUNTER — Emergency Department (HOSPITAL_COMMUNITY): Payer: Medicaid Other

## 2014-12-04 ENCOUNTER — Emergency Department (HOSPITAL_COMMUNITY)
Admission: EM | Admit: 2014-12-04 | Discharge: 2014-12-04 | Disposition: A | Discharge status: Home / Self Care | Payer: Medicaid Other | Attending: Emergency Medicine | Admitting: Emergency Medicine

## 2014-12-04 DIAGNOSIS — R630 Anorexia: Secondary | ICD-10-CM | POA: Insufficient documentation

## 2014-12-04 DIAGNOSIS — J45901 Unspecified asthma with (acute) exacerbation: Secondary | ICD-10-CM | POA: Insufficient documentation

## 2014-12-04 DIAGNOSIS — Z3202 Encounter for pregnancy test, result negative: Secondary | ICD-10-CM | POA: Insufficient documentation

## 2014-12-04 DIAGNOSIS — Z79899 Other long term (current) drug therapy: Secondary | ICD-10-CM | POA: Insufficient documentation

## 2014-12-04 LAB — CBC WITH DIFFERENTIAL/PLATELET
BASOS ABS: 0 10*3/uL (ref 0.0–0.1)
BASOS PCT: 0 % (ref 0–1)
EOS PCT: 9 % — AB (ref 0–5)
Eosinophils Absolute: 0.6 10*3/uL (ref 0.0–0.7)
HCT: 45.1 % (ref 36.0–46.0)
Hemoglobin: 15.2 g/dL — ABNORMAL HIGH (ref 12.0–15.0)
Lymphocytes Relative: 32 % (ref 12–46)
Lymphs Abs: 2 10*3/uL (ref 0.7–4.0)
MCH: 25.2 pg — ABNORMAL LOW (ref 26.0–34.0)
MCHC: 33.7 g/dL (ref 30.0–36.0)
MCV: 74.9 fL — ABNORMAL LOW (ref 78.0–100.0)
MONO ABS: 0.5 10*3/uL (ref 0.1–1.0)
Monocytes Relative: 8 % (ref 3–12)
NEUTROS PCT: 51 % (ref 43–77)
Neutro Abs: 3.3 10*3/uL (ref 1.7–7.7)
PLATELETS: 275 10*3/uL (ref 150–400)
RBC: 6.02 MIL/uL — ABNORMAL HIGH (ref 3.87–5.11)
RDW: 13.4 % (ref 11.5–15.5)
WBC: 6.4 10*3/uL (ref 4.0–10.5)

## 2014-12-04 LAB — URINALYSIS, ROUTINE W REFLEX MICROSCOPIC
Glucose, UA: NEGATIVE mg/dL
HGB URINE DIPSTICK: NEGATIVE
LEUKOCYTES UA: NEGATIVE
NITRITE: NEGATIVE
PH: 6 (ref 5.0–8.0)
PROTEIN: 100 mg/dL — AB
Specific Gravity, Urine: 1.036 — ABNORMAL HIGH (ref 1.005–1.030)
Urobilinogen, UA: 1 mg/dL (ref 0.0–1.0)

## 2014-12-04 LAB — BASIC METABOLIC PANEL
Anion gap: 12 (ref 5–15)
BUN: 12 mg/dL (ref 6–23)
CHLORIDE: 108 mmol/L (ref 96–112)
CO2: 20 mmol/L (ref 19–32)
Calcium: 9.8 mg/dL (ref 8.4–10.5)
Creatinine, Ser: 0.8 mg/dL (ref 0.50–1.10)
Glucose, Bld: 76 mg/dL (ref 70–99)
Potassium: 3.9 mmol/L (ref 3.5–5.1)
SODIUM: 140 mmol/L (ref 135–145)

## 2014-12-04 LAB — URINE MICROSCOPIC-ADD ON

## 2014-12-04 LAB — PREGNANCY, URINE: PREG TEST UR: NEGATIVE

## 2014-12-04 MED ORDER — ALBUTEROL SULFATE (2.5 MG/3ML) 0.083% IN NEBU
5.0000 mg | INHALATION_SOLUTION | Freq: Once | RESPIRATORY_TRACT | Status: AC
  Administered 2014-12-04: 5 mg via RESPIRATORY_TRACT
  Filled 2014-12-04: qty 6

## 2014-12-04 MED ORDER — PREDNISONE 20 MG PO TABS
60.0000 mg | ORAL_TABLET | Freq: Once | ORAL | Status: AC
  Administered 2014-12-04: 60 mg via ORAL
  Filled 2014-12-04: qty 3

## 2014-12-04 MED ORDER — PREDNISONE 10 MG PO TABS: 20.0000 mg | ORAL_TABLET | Freq: Every day | ORAL | Status: DC

## 2014-12-04 MED ORDER — ALBUTEROL SULFATE HFA 108 (90 BASE) MCG/ACT IN AERS
2.0000 | INHALATION_SPRAY | RESPIRATORY_TRACT | Status: DC | PRN
  Administered 2014-12-04: 2 via RESPIRATORY_TRACT
  Filled 2014-12-04: qty 6.7

## 2014-12-04 MED ORDER — ALBUTEROL SULFATE (2.5 MG/3ML) 0.083% IN NEBU
2.5000 mg | INHALATION_SOLUTION | RESPIRATORY_TRACT | Status: DC | PRN
Start: 2014-12-04 — End: 2015-09-06

## 2014-12-04 NOTE — ED Notes (Signed)
Patient transported to X-ray 

## 2014-12-04 NOTE — ED Notes (Addendum)
Pt states that she having trouble breathing, mainly at night for the past week and half.  Pt has nasal congestion that drains down the back of her throat.  Pt states that this happened last year as well. Pt has nebulizer but out of albuterol.   Pt also states that she is still having loss of appetite and weight and would like that issue addressed while she is here today.  Pt states that since December she has lost weight and after eating around 5 bites pt feels nauseated.

## 2014-12-04 NOTE — ED Provider Notes (Signed)
CSN: [id number]     Arrival date & time 12/04/14  1251 History   First MD Initiated Contact with Patient 12/04/14 1313     Chief Complaint  Patient presents with  . Nasal Congestion  . trouble breathing   . Anorexia     (Consider location/radiation/quality/duration/timing/severity/associated sxs/prior Treatment) HPI Comments: Patient presents with wheezing and shortness of breath. She states it's been going on for about a week. She has history of seasonal allergies which is almost year-round and she takes Singulair for this. She is out of her albuterol inhaler. Her cough is mostly nonproductive. She denies any fevers or chills. She denies any chest pain other than some soreness with coughing. She also reports that she lost some weight last December with an episode of asthma and she has been able to regain the weight. She has some loss of appetite and nausea occasionally after she eats. She denies any ongoing abdominal pain. She occasionally has some loose stools. She doesn't report any symptoms with specific types of foods. She denies any further weight loss but she hasn't been able to regain her weight.   Past Medical History  Diagnosis Date  . Asthma   . Seasonal allergies    History reviewed. No pertinent past surgical history. No family history on file. History  Substance Use Topics  . Smoking status: Never Smoker   . Smokeless tobacco: Never Used  . Alcohol Use: Yes   OB History    No data available     Review of Systems  Constitutional: Positive for appetite change, fatigue and unexpected weight change. Negative for fever, chills and diaphoresis.  HENT: Negative for congestion, rhinorrhea and sneezing.   Eyes: Negative.   Respiratory: Positive for cough, shortness of breath and wheezing. Negative for chest tightness.   Cardiovascular: Negative for chest pain and leg swelling.  Gastrointestinal: Negative for nausea, vomiting, abdominal pain, diarrhea and blood in stool.   Genitourinary: Negative for frequency, hematuria, flank pain and difficulty urinating.  Musculoskeletal: Negative for back pain and arthralgias.  Skin: Negative for rash.  Neurological: Negative for dizziness, speech difficulty, weakness, numbness and headaches.      Allergies  Review of patient's allergies indicates no known allergies.  Home Medications   Prior to Admission medications   Medication Sig Start Date End Date Taking? Authorizing Provider  diphenhydrAMINE (BENADRYL) 12.5 MG/5ML elixir Take 25 mg by mouth 4 (four) times daily as needed for allergies.   Yes Historical Provider, MD  montelukast (SINGULAIR) 10 MG tablet Take 10 mg by mouth at bedtime.   Yes Historical Provider, MD  albuterol (PROVENTIL) (2.5 MG/3ML) 0.083% nebulizer solution Take 3 mLs (2.5 mg total) by nebulization every 4 (four) hours as needed for wheezing or shortness of breath. 12/04/14   Rolan Bucco, MD  levofloxacin (LEVAQUIN) 500 MG tablet Take 1 tablet (500 mg total) by mouth daily. Patient not taking: Reported on 12/04/2014 08/17/14   Alison Murray, MD  predniSONE (DELTASONE) 10 MG tablet Take 2 tablets (20 mg total) by mouth daily. 12/04/14   Rolan Bucco, MD   BP 132/84 mmHg  Pulse 114  Temp(Src) 98.2 F (36.8 C) (Oral)  Resp 16  SpO2 97%  LMP 11/04/2014 Physical Exam  Constitutional: She is oriented to person, place, and time. She appears well-developed and well-nourished.  HENT:  Head: Normocephalic and atraumatic.  Eyes: Pupils are equal, round, and reactive to light.  Neck: Normal range of motion. Neck supple.  Cardiovascular: Normal rate, regular rhythm  and normal heart sounds.   Pulmonary/Chest: Effort normal. No respiratory distress. She has wheezes (Mild bilateral axillary wheezing, no increased work of breathing). She has no rales. She exhibits no tenderness.  Abdominal: Soft. Bowel sounds are normal. There is no tenderness. There is no rebound and no guarding.  Musculoskeletal:  Normal range of motion. She exhibits no edema.  No calf tenderness  Lymphadenopathy:    She has no cervical adenopathy.  Neurological: She is alert and oriented to person, place, and time.  Skin: Skin is warm and dry. No rash noted.  Psychiatric: She has a normal mood and affect.    ED Course  Procedures (including critical care time) Labs Review Labs Reviewed  CBC WITH DIFFERENTIAL/PLATELET - Abnormal; Notable for the following:    RBC 6.02 (*)    Hemoglobin 15.2 (*)    MCV 74.9 (*)    MCH 25.2 (*)    Eosinophils Relative 9 (*)    All other components within normal limits  URINALYSIS, ROUTINE W REFLEX MICROSCOPIC - Abnormal; Notable for the following:    Color, Urine AMBER (*)    Specific Gravity, Urine 1.036 (*)    Bilirubin Urine SMALL (*)    Ketones, ur >80 (*)    Protein, ur 100 (*)    All other components within normal limits  BASIC METABOLIC PANEL  PREGNANCY, URINE  URINE MICROSCOPIC-ADD ON    Imaging Review Dg Chest 2 View  12/04/2014   CLINICAL DATA:  Anorexia.  Nasal congestion.  Asthma.  EXAM: CHEST  2 VIEW  COMPARISON:  None.  FINDINGS: The heart size and mediastinal contours are within normal limits. Both lungs are clear. The visualized skeletal structures are unremarkable.  IMPRESSION: No active cardiopulmonary disease.   Electronically Signed   By: Andreas NewportGeoffrey  Lamke M.D.   On: 12/04/2014 14:34     EKG Interpretation None      MDM   Final diagnoses:  Asthma exacerbation    Patient presents with asthma exacerbation. Her chest x-ray is clear without evidence of pneumonia. She was given nebulizer treatment here as well as a dose of prednisone. She's feeling much better and her lung exam has improved. She has no increased work of breathing. Her oxygen saturations are normal. Her labs are unremarkable other than she has some ketones and protein in her urine. I advised her that she needs to follow-up with her primary care physician regarding her ongoing weight  issues and appetite problems. She was discharged home in good condition advised to return if she has any worsening symptoms. She was discharged with an albuterol inhaler and given prescriptions for albuterol nebulizer solution as well as a prednisone burst.    Rolan BuccoMelanie Alando Colleran, MD 12/04/14 [phone]

## 2014-12-04 NOTE — Discharge Instructions (Signed)

## 2015-03-19 ENCOUNTER — Emergency Department (HOSPITAL_COMMUNITY)
Admission: EM | Admit: 2015-03-19 | Discharge: 2015-03-19 | Disposition: A | Discharge status: Home / Self Care | Payer: Medicaid Other | Attending: Emergency Medicine | Admitting: Emergency Medicine

## 2015-03-19 ENCOUNTER — Encounter (HOSPITAL_COMMUNITY): Payer: Self-pay | Admitting: Emergency Medicine

## 2015-03-19 DIAGNOSIS — Z792 Long term (current) use of antibiotics: Secondary | ICD-10-CM | POA: Insufficient documentation

## 2015-03-19 DIAGNOSIS — Z7952 Long term (current) use of systemic steroids: Secondary | ICD-10-CM | POA: Insufficient documentation

## 2015-03-19 DIAGNOSIS — J45901 Unspecified asthma with (acute) exacerbation: Secondary | ICD-10-CM | POA: Insufficient documentation

## 2015-03-19 DIAGNOSIS — Z3202 Encounter for pregnancy test, result negative: Secondary | ICD-10-CM | POA: Insufficient documentation

## 2015-03-19 DIAGNOSIS — Z79899 Other long term (current) drug therapy: Secondary | ICD-10-CM | POA: Insufficient documentation

## 2015-03-19 LAB — POC URINE PREG, ED: Preg Test, Ur: NEGATIVE

## 2015-03-19 MED ORDER — ALBUTEROL SULFATE HFA 108 (90 BASE) MCG/ACT IN AERS: 1.0000 | INHALATION_SPRAY | Freq: Four times a day (QID) | RESPIRATORY_TRACT | Status: DC | PRN

## 2015-03-19 MED ORDER — ALBUTEROL SULFATE (2.5 MG/3ML) 0.083% IN NEBU
5.0000 mg | INHALATION_SOLUTION | Freq: Once | RESPIRATORY_TRACT | Status: AC
  Administered 2015-03-19: 5 mg via RESPIRATORY_TRACT
  Filled 2015-03-19: qty 6

## 2015-03-19 MED ORDER — PREDNISONE 20 MG PO TABS
60.0000 mg | ORAL_TABLET | Freq: Once | ORAL | Status: AC
  Administered 2015-03-19: 60 mg via ORAL
  Filled 2015-03-19: qty 3

## 2015-03-19 MED ORDER — PREDNISONE 20 MG PO TABS: ORAL_TABLET | ORAL | Status: DC

## 2015-03-19 NOTE — ED Notes (Signed)
Patient here with complaints of SOB since yesterday. 98% RA. Hx asthma

## 2015-03-19 NOTE — Discharge Instructions (Signed)

## 2015-03-19 NOTE — ED Provider Notes (Signed)
CSN: [id number]     Arrival date & time 03/19/15  1841 History   First MD Initiated Contact with Patient 03/19/15 1851     No chief complaint on file.    (Consider location/radiation/quality/duration/timing/severity/associated sxs/prior Treatment) Patient is a 19 y.o. female presenting with shortness of breath. The history is provided by the patient.  Shortness of Breath Severity:  Mild Onset quality:  Gradual Duration:  3 weeks Timing:  Constant Progression:  Worsening Chronicity:  Recurrent Context: known allergens   Relieved by:  Nothing Worsened by:  Nothing tried Ineffective treatments:  None tried Associated symptoms: cough   Associated symptoms: no abdominal pain, no chest pain, no fever, no headaches, no neck pain and no vomiting     Past Medical History  Diagnosis Date  . Asthma   . Seasonal allergies    No past surgical history on file. No family history on file. History  Substance Use Topics  . Smoking status: Never Smoker   . Smokeless tobacco: Never Used  . Alcohol Use: Yes   OB History    No data available     Review of Systems  Constitutional: Negative for fever and fatigue.  HENT: Positive for congestion. Negative for drooling.   Eyes: Negative for pain.  Respiratory: Positive for cough and shortness of breath.   Cardiovascular: Negative for chest pain.  Gastrointestinal: Negative for nausea, vomiting, abdominal pain and diarrhea.  Genitourinary: Negative for dysuria and hematuria.  Musculoskeletal: Negative for back pain, gait problem and neck pain.  Skin: Negative for color change.  Neurological: Negative for dizziness and headaches.  Hematological: Negative for adenopathy.  Psychiatric/Behavioral: Negative for behavioral problems.  All other systems reviewed and are negative.     Allergies  Review of patient's allergies indicates no known allergies.  Home Medications   Prior to Admission medications   Medication Sig Start Date End  Date Taking? Authorizing Provider  albuterol (PROVENTIL) (2.5 MG/3ML) 0.083% nebulizer solution Take 3 mLs (2.5 mg total) by nebulization every 4 (four) hours as needed for wheezing or shortness of breath. 12/04/14   Rolan Bucco, MD  diphenhydrAMINE (BENADRYL) 12.5 MG/5ML elixir Take 25 mg by mouth 4 (four) times daily as needed for allergies.    Historical Provider, MD  levofloxacin (LEVAQUIN) 500 MG tablet Take 1 tablet (500 mg total) by mouth daily. Patient not taking: Reported on 12/04/2014 08/17/14   Alison Murray, MD  montelukast (SINGULAIR) 10 MG tablet Take 10 mg by mouth at bedtime.    Historical Provider, MD  predniSONE (DELTASONE) 10 MG tablet Take 2 tablets (20 mg total) by mouth daily. 12/04/14   Rolan Bucco, MD   BP 131/99 mmHg  Pulse 109  Temp(Src) 98.2 F (36.8 C) (Oral)  Resp 20  SpO2 93% Physical Exam  Constitutional: She is oriented to person, place, and time. She appears well-developed and well-nourished.  HENT:  Head: Normocephalic.  Mouth/Throat: Oropharynx is clear and moist. No oropharyngeal exudate.  Eyes: Conjunctivae and EOM are normal. Pupils are equal, round, and reactive to light.  Neck: Normal range of motion. Neck supple.  Cardiovascular: Normal rate, regular rhythm, normal heart sounds and intact distal pulses.  Exam reveals no gallop and no friction rub.   No murmur heard. Pulmonary/Chest: No respiratory distress. She has wheezes (diffuse mild wheezing.).  Abdominal: Soft. Bowel sounds are normal. There is no tenderness. There is no rebound and no guarding.  Musculoskeletal: Normal range of motion. She exhibits no edema or tenderness.  Neurological:  She is alert and oriented to person, place, and time.  Skin: Skin is warm and dry.  Psychiatric: She has a normal mood and affect. Her behavior is normal.  Nursing note and vitals reviewed.   ED Course  Procedures (including critical care time) Labs Review Labs Reviewed  POC URINE PREG, ED     Imaging Review No results found.   EKG Interpretation None      MDM   Final diagnoses:  Asthma exacerbation    7:12 PM 19 y.o. female with a history of asthma and seasonal allergies who presents with cough productive of white sputum, sinus congestion, and shortness of breath over the last 2-3 weeks. The patient has had similar issues in the past with asthma. She denies any fevers. Some mild chest wall pain with coughing. Mildly tachycardic, vital signs otherwise unremarkable. Diffuse wheezing on exam. Likely asthma. We'll give breathing treatment and prednisone. Do not think chest x-ray needed, patient agrees. Patient typically uses an inhaler as needed but has been out of it for about one month.  9:08 PM: Patient continues to have some mild extradural wheezing but is feeling much better on exam and would like to go home. We'll provide her a prescription for prednisone and albuterol inhaler. I have discussed the diagnosis/risks/treatment options with the patient and believe the pt to be eligible for discharge home to follow-up with her pcp as needed. We also discussed returning to the ED immediately if new or worsening sx occur. We discussed the sx which are most concerning (e.g., worsening sob, cp, fever) that necessitate immediate return. Medications administered to the patient during their visit and any new prescriptions provided to the patient are listed below.  Medications given during this visit Medications  albuterol (PROVENTIL) (2.5 MG/3ML) 0.083% nebulizer solution 5 mg (5 mg Nebulization Given 03/19/15 1919)  predniSONE (DELTASONE) tablet 60 mg (60 mg Oral Given 03/19/15 1919)  albuterol (PROVENTIL) (2.5 MG/3ML) 0.083% nebulizer solution 5 mg (5 mg Nebulization Given 03/19/15 2017)    New Prescriptions   ALBUTEROL (PROVENTIL HFA;VENTOLIN HFA) 108 (90 BASE) MCG/ACT INHALER    Inhale 1-2 puffs into the lungs every 6 (six) hours as needed for wheezing or shortness of breath.    PREDNISONE (DELTASONE) 20 MG TABLET    Take 2 tablets by mouth on day 1 and 2. Take 1 tablet by mouth on day 3 and 4.     Purvis Sheffield, MD 03/19/15 2109

## 2015-04-29 ENCOUNTER — Emergency Department (HOSPITAL_COMMUNITY)
Admission: EM | Admit: 2015-04-29 | Discharge: 2015-04-29 | Disposition: A | Discharge status: Home / Self Care | Payer: Self-pay | Attending: Emergency Medicine | Admitting: Emergency Medicine

## 2015-04-29 ENCOUNTER — Emergency Department (HOSPITAL_COMMUNITY): Payer: Medicaid Other

## 2015-04-29 ENCOUNTER — Emergency Department (HOSPITAL_COMMUNITY): Payer: Self-pay

## 2015-04-29 ENCOUNTER — Encounter (HOSPITAL_COMMUNITY): Payer: Self-pay | Admitting: Emergency Medicine

## 2015-04-29 DIAGNOSIS — R7989 Other specified abnormal findings of blood chemistry: Secondary | ICD-10-CM

## 2015-04-29 DIAGNOSIS — I499 Cardiac arrhythmia, unspecified: Secondary | ICD-10-CM | POA: Insufficient documentation

## 2015-04-29 DIAGNOSIS — J45901 Unspecified asthma with (acute) exacerbation: Secondary | ICD-10-CM | POA: Insufficient documentation

## 2015-04-29 DIAGNOSIS — R000 Tachycardia, unspecified: Secondary | ICD-10-CM

## 2015-04-29 DIAGNOSIS — R791 Abnormal coagulation profile: Secondary | ICD-10-CM | POA: Insufficient documentation

## 2015-04-29 DIAGNOSIS — Z79899 Other long term (current) drug therapy: Secondary | ICD-10-CM | POA: Insufficient documentation

## 2015-04-29 DIAGNOSIS — R0981 Nasal congestion: Secondary | ICD-10-CM

## 2015-04-29 LAB — D-DIMER, QUANTITATIVE (NOT AT ARMC): D DIMER QUANT: 0.61 ug{FEU}/mL — AB (ref 0.00–0.48)

## 2015-04-29 MED ORDER — METHYLPREDNISOLONE SODIUM SUCC 125 MG IJ SOLR
125.0000 mg | Freq: Once | INTRAMUSCULAR | Status: AC
  Administered 2015-04-29: 125 mg via INTRAVENOUS
  Filled 2015-04-29: qty 2

## 2015-04-29 MED ORDER — CETIRIZINE-PSEUDOEPHEDRINE ER 5-120 MG PO TB12: 1.0000 | ORAL_TABLET | Freq: Every day | ORAL | Status: DC

## 2015-04-29 MED ORDER — IOHEXOL 350 MG/ML SOLN
100.0000 mL | Freq: Once | INTRAVENOUS | Status: AC | PRN
  Administered 2015-04-29: 100 mL via INTRAVENOUS

## 2015-04-29 MED ORDER — IPRATROPIUM-ALBUTEROL 0.5-2.5 (3) MG/3ML IN SOLN
3.0000 mL | Freq: Once | RESPIRATORY_TRACT | Status: AC
  Administered 2015-04-29: 3 mL via RESPIRATORY_TRACT
  Filled 2015-04-29: qty 3

## 2015-04-29 NOTE — ED Provider Notes (Signed)
CSN: [id number]     Arrival date & time 04/29/15  4098 History   First MD Initiated Contact with Patient 04/29/15 1003     Chief Complaint  Patient presents with  . nasal congestion/allergies      (Consider location/radiation/quality/duration/timing/severity/associated sxs/prior Treatment) HPI Danielle Carlson is a 19 y.o. female History of asthma and seasonal allergies comes in for nasal congestion and allergy exacerbation. Patient states she has been struggling with seasonal allergies for the past 1.5 years. She reports most recent episode started 3 days ago Symptoms consist of nasal congestion, runny nose, sneezing. She denies any Fevers, chills, productive cough, chest pain, Shortness of breath, leg swelling, recent travel or surgeries, or OCP use. She did use an inhaler PTA and it seemed to help her symptoms. No other aggravating or modifying factors.  Past Medical History  Diagnosis Date  . Asthma   . Seasonal allergies    History reviewed. No pertinent past surgical history. No family history on file. Social History  Substance Use Topics  . Smoking status: Never Smoker   . Smokeless tobacco: Never Used  . Alcohol Use: Yes   OB History    No data available     Review of Systems A 10 point review of systems was completed and was negative except for pertinent positives and negatives as mentioned in the history of present illness     Allergies  Review of patient's allergies indicates no known allergies.  Home Medications   Prior to Admission medications   Medication Sig Start Date End Date Taking? Authorizing Provider  albuterol (PROVENTIL HFA;VENTOLIN HFA) 108 (90 BASE) MCG/ACT inhaler Inhale 1-2 puffs into the lungs every 6 (six) hours as needed for wheezing or shortness of breath. 03/19/15  Yes Purvis Sheffield, MD  albuterol (PROVENTIL) (2.5 MG/3ML) 0.083% nebulizer solution Take 3 mLs (2.5 mg total) by nebulization every 4 (four) hours as needed for wheezing or  shortness of breath. 12/04/14  Yes Rolan Bucco, MD  diphenhydrAMINE (BENADRYL) 12.5 MG/5ML elixir Take 25 mg by mouth 4 (four) times daily as needed for allergies.   Yes Historical Provider, MD  montelukast (SINGULAIR) 10 MG tablet Take 10 mg by mouth at bedtime.   Yes Historical Provider, MD  cetirizine-pseudoephedrine (ZYRTEC-D) 5-120 MG per tablet Take 1 tablet by mouth daily. 04/29/15   Joycie Peek, PA-C  predniSONE (DELTASONE) 10 MG tablet Take 2 tablets (20 mg total) by mouth daily. Patient not taking: Reported on 03/19/2015 12/04/14   Rolan Bucco, MD  predniSONE (DELTASONE) 20 MG tablet Take 2 tablets by mouth on day 1 and 2. Take 1 tablet by mouth on day 3 and 4. Patient not taking: Reported on 04/29/2015 03/20/15   Purvis Sheffield, MD   BP 102/74 mmHg  Pulse 81  Temp(Src) 98 F (36.7 C) (Oral)  Resp 24  SpO2 94%  LMP 04/26/2015 Physical Exam  Constitutional:  Awake, alert, nontoxic appearance.  HENT:  Head: Atraumatic.  Eyes: Right eye exhibits no discharge. Left eye exhibits no discharge.  Neck: Neck supple.  Pulmonary/Chest: Effort normal. She exhibits no tenderness.  Diffuse wheezing in all fields. No other adventitious lung sounds. No evidence of respiratory distress  Abdominal: Soft. There is no tenderness. There is no rebound.  Musculoskeletal: She exhibits no tenderness.  Baseline ROM, no obvious new focal weakness.  Neurological:  Mental status and motor strength appears baseline for patient and situation.  Skin: No rash noted.  Psychiatric: She has a normal mood and affect.  Nursing note and vitals reviewed.   ED Course  Procedures (including critical care time) Labs Review Labs Reviewed  D-DIMER, QUANTITATIVE (NOT AT [facility]) - Abnormal; Notable for the following:    D-Dimer, Quant 0.61 (*)    All other components within normal limits    Imaging Review Dg Chest 2 View  04/29/2015   CLINICAL DATA:  19 year old female with nasal congestion and wheezing.  History of asthma.  EXAM: CHEST  2 VIEW  COMPARISON:  Chest x-ray a 12/04/2014.  FINDINGS: Lung volumes are normal. No consolidative airspace disease. No pleural effusions. No pneumothorax. No pulmonary nodule or mass noted. Pulmonary vasculature and the cardiomediastinal silhouette are within normal limits.  IMPRESSION: No radiographic evidence of acute cardiopulmonary disease.   Electronically Signed   By: Trudie Reed M.D.   On: 04/29/2015 12:39   Ct Angio Chest Pe W/cm &/or Wo Cm  04/29/2015   CLINICAL DATA:  19 year old female with shortness of breath and elevated D-dimer  EXAM: CT ANGIOGRAPHY CHEST WITH CONTRAST  TECHNIQUE: Multidetector CT imaging of the chest was performed using the standard protocol during bolus administration of intravenous contrast. Multiplanar CT image reconstructions and MIPs were obtained to evaluate the vascular anatomy.  CONTRAST:  OMNIPAQUE IOHEXOL 350 MG/ML SOLN  COMPARISON:  Chest x-ray obtained earlier today  FINDINGS: Mediastinum: Unremarkable CT appearance of the thyroid gland. No suspicious mediastinal or hilar adenopathy. Strandy soft tissue in the anterior mediastinum consistent with residual thymus. The thoracic esophagus is unremarkable.  Heart/Vascular: Adequate opacification of the pulmonary arteries to the proximal subsegmental level. No evidence of central filling defect to suggest acute pulmonary embolus. Normal 3 vessel arch anatomy. No aneurysm. The heart is within normal limits for size. No pericardial effusion.  Lungs/Pleura: No evidence of pleural effusion. Very mild scattered regions of bronchial wall thickening. There are several patchy foci of ground-glass attenuation airspace opacity affecting all lobes of both lungs. While primarily central in distribution, there are a few peripheral foci. Tiny subpleural lymph node affiliated with the minor fissure.  Bones/Soft Tissues: No acute fracture or aggressive appearing lytic or blastic osseous lesion.   Upper Abdomen: Visualized upper abdominal organs are unremarkable.  Review of the MIP images confirms the above findings.  IMPRESSION: 1. Negative for acute pulmonary embolus. 2. Multiple small patchy foci of ground-glass attenuation airspace opacity within all lobes of both lungs in a peribronchovascular distribution accompanied by scattered areas of mild bronchial wall thickening. Differential considerations include acute bronchitis/asthma exacerbation with associated areas of subsegmental atelectasis versus an acute viral or atypical respiratory infection. A multifocal inflammatory process or pneumonitis could appear similar but is considered less likely.   Electronically Signed   By: Malachy Moan M.D.   On: 04/29/2015 13:55   I have personally reviewed and evaluated these images and lab results as part of my medical decision-making.   EKG Interpretation   Date/Time:  Saturday April 29 2015 11:34:37 EDT Ventricular Rate:  108 PR Interval:  131 QRS Duration: 79 QT Interval:  332 QTC Calculation: 445 R Axis:   99 Text Interpretation:  Sinus arrhythmia Multiform ventricular premature  complexes Consider right atrial enlargement Borderline right axis  deviation Borderline repolarization abnormality No previous ECGs available  Confirmed by Manus Gunning  MD, STEPHEN [phone]) on 04/29/2015 11:43:34 AM     Meds given in ED:  Medications  ipratropium-albuterol (DUONEB) 0.5-2.5 (3) MG/3ML nebulizer solution 3 mL (3 mLs Nebulization Given 04/29/15 1010)  methylPREDNISolone sodium succinate (SOLU-MEDROL) 125 mg/2 mL  injection 125 mg (125 mg Intravenous Given 04/29/15 1140)  iohexol (OMNIPAQUE) 350 MG/ML injection 100 mL (100 mLs Intravenous Contrast Given 04/29/15 1335)    New Prescriptions   CETIRIZINE-PSEUDOEPHEDRINE (ZYRTEC-D) 5-120 MG PER TABLET    Take 1 tablet by mouth daily.   Filed Vitals:   04/29/15 1200 04/29/15 1230 04/29/15 1300 04/29/15 1330  BP: 116/97 111/77 87/66 102/74  Pulse:  105  122 81  Temp:      TempSrc:      Resp: 11 19 19 24   SpO2: 95%  92% 94%    MDM  Vitals stable  -afebrile Patient with tachycardia, reports using albuterol inhaler Immediately before being seen. Pt resting comfortably in ED. PE--Diffuse wheezing in all fields- Will give DuoNeb breathing treatment and reassess Upon reassessment-, Wheezing is still evident however improved.Patient does not exhibit any respiratory distress, however heart rate will alternate between fast and slow rhythms and patient maintains 92% oxygen saturation rate.  Will obtain chest x-ray, EKG, d-dimer. Given 125 mg Solu-Medrol. D-dimer positive, will obtain CT Angio of chest to rule out PE. CT of chest is negative for any acute pulmonary embolus. There are multiple small patchy foci of ground glass attenuation in the airspace of both lungs,likely consistent with asthma exacerbation.  Upon reevaluation at 2:00 PM, patient states she is symptom free. She denies any chest pain or shortness of breath. Her heart rate does remain slightly elevated at 111 on my exam. Although patient is tachycardic, she appears well, with no apparent sign of infection. No clinical evidence of dehydration. Her wheezing is significantly improved. Oxygen saturations are 96% on room air. No tachypnea or other evidence of respiratory distress.She has an inhaler at home, will be able to follow up with PCP this week for reevaluation. [facility], patient's pharmacy of choice, prescribed prednisone 10 mg taper for 5 days.  Patient reports documented history of asthma and seasonal allergies and this is similar to previous symptoms.Will DC with allergy medicines and decongestant. Patient has an inhaler at home. We'll also give cardiology referral for further evaluation of abnormal EKG and Nonspecific tachycardia.No evidence of other acute or emergent pathology at this time. I discussed all relevant lab findings and imaging results with pt and  they verbalized understanding. Discussed f/u with PCP within 48 hrs and return precautions, pt very amenable to plan.  Final diagnoses:  Heart rate fast  Nasal congestion  Positive D-dimer        Joycie Peek, PA-C 04/29/15 [address] PA-C 04/29/15 1628  Glynn Octave, MD 04/30/15 2352

## 2015-04-29 NOTE — ED Notes (Signed)
Pt is a&ox4 and denied questions r/t dc instruction. Pt encouraged to stay hydrated. She is ambulatory

## 2015-04-29 NOTE — ED Notes (Signed)
Bed: WA02 Expected date:  Expected time:  Means of arrival:  Comments: Hold for triage 6 

## 2015-04-29 NOTE — ED Notes (Signed)
Per pt, states she has allergies-nasal congestion which started last night-has had same symptoms in the past-used inhaler prior to coming and it has improved her breathing

## 2015-04-29 NOTE — Discharge Instructions (Signed)
You were evaluated in the ED today and there does not appear to be an emergent cause for your symptoms at this time. It is important for you to follow-up with your doctor and cardiology for reevaluation of your fast heart rate. Your chest x-ray, labs, CT of her chest are all reassuring. Please take your allergy medications as prescribed. Return to ED for worsening symptoms.  Nonspecific Tachycardia Tachycardia is a faster than normal heartbeat (more than 100 beats per minute). In adults, the heart normally beats between 60 and 100 times a minute. A fast heartbeat may be a normal response to exercise or stress. It does not necessarily mean that something is wrong. However, sometimes when your heart beats too fast it may not be able to pump enough blood to the rest of your body. This can result in chest pain, shortness of breath, dizziness, and even fainting. Nonspecific tachycardia means that the specific cause or pattern of your tachycardia is unknown. CAUSES  Tachycardia may be harmless or it may be due to a more serious underlying cause. Possible causes of tachycardia include:  Exercise or exertion.  Fever.  Pain or injury.  Infection.  Loss of body fluids (dehydration).  Overactive thyroid.  Lack of red blood cells (anemia).  Anxiety and stress.  Alcohol.  Caffeine.  Tobacco products.  Diet pills.  Illegal drugs.  Heart disease. SYMPTOMS  Rapid or irregular heartbeat (palpitations).  Suddenly feeling your heart beating (cardiac awareness).  Dizziness.  Tiredness (fatigue).  Shortness of breath.  Chest pain.  Nausea.  Fainting. DIAGNOSIS  Your caregiver will perform a physical exam and take your medical history. In some cases, a heart specialist (cardiologist) may be consulted. Your caregiver may also order:  Blood tests.  Electrocardiography. This test records the electrical activity of your heart.  A heart monitoring test. TREATMENT  Treatment will  depend on the likely cause of your tachycardia. The goal is to treat the underlying cause of your tachycardia. Treatment methods may include:  Replacement of fluids or blood through an intravenous (IV) tube for moderate to severe dehydration or anemia.  New medicines or changes in your current medicines.  Diet and lifestyle changes.  Treatment for certain infections.  Stress relief or relaxation methods. HOME CARE INSTRUCTIONS   Rest.  Drink enough fluids to keep your urine clear or pale yellow.  Do not smoke.  Avoid:  Caffeine.  Tobacco.  Alcohol.  Chocolate.  Stimulants such as over-the-counter diet pills or pills that help you stay awake.  Situations that cause anxiety or stress.  Illegal drugs such as marijuana, phencyclidine (PCP), and cocaine.  Only take medicine as directed by your caregiver.  Keep all follow-up appointments as directed by your caregiver. SEEK IMMEDIATE MEDICAL CARE IF:   You have pain in your chest, upper arms, jaw, or neck.  You become weak, dizzy, or feel faint.  You have palpitations that will not go away.  You vomit, have diarrhea, or pass blood in your stool.  Your skin is cool, pale, and wet.  You have a fever that will not go away with rest, fluids, and medicine. MAKE SURE YOU:   Understand these instructions.  Will watch your condition.  Will get help right away if you are not doing well or get worse. Document Released: 09/19/2004 Document Revised: 11/04/2011 Document Reviewed: 07/23/2011 [facility] Patient Information 2015 [place], [place]. This information is not intended to replace advice given to you by your health care provider. Make sure you discuss  any questions you have with your health care provider. ° °

## 2015-04-29 NOTE — ED Notes (Signed)
Bed: WTR6 Expected date:  Expected time:  Means of arrival:  Comments: 

## 2015-04-29 NOTE — ED Notes (Signed)
Pt stated that she has a history of an irregular and rapid heart beat on previous hospital visits

## 2015-09-05 ENCOUNTER — Emergency Department (HOSPITAL_COMMUNITY): Payer: Medicaid Other

## 2015-09-05 ENCOUNTER — Encounter (HOSPITAL_COMMUNITY): Payer: Self-pay | Admitting: Emergency Medicine

## 2015-09-05 ENCOUNTER — Inpatient Hospital Stay (HOSPITAL_COMMUNITY)
Admission: EM | Admit: 2015-09-05 | Discharge: 2015-09-08 | DRG: 202 | Disposition: A | Discharge status: Home / Self Care | Payer: Self-pay | Attending: Internal Medicine | Admitting: Internal Medicine

## 2015-09-05 DIAGNOSIS — J45909 Unspecified asthma, uncomplicated: Secondary | ICD-10-CM | POA: Diagnosis present

## 2015-09-05 DIAGNOSIS — Z79899 Other long term (current) drug therapy: Secondary | ICD-10-CM

## 2015-09-05 DIAGNOSIS — F1290 Cannabis use, unspecified, uncomplicated: Secondary | ICD-10-CM | POA: Diagnosis present

## 2015-09-05 DIAGNOSIS — R000 Tachycardia, unspecified: Secondary | ICD-10-CM | POA: Diagnosis present

## 2015-09-05 DIAGNOSIS — J4541 Moderate persistent asthma with (acute) exacerbation: Secondary | ICD-10-CM

## 2015-09-05 DIAGNOSIS — J45901 Unspecified asthma with (acute) exacerbation: Principal | ICD-10-CM | POA: Diagnosis present

## 2015-09-05 DIAGNOSIS — J9601 Acute respiratory failure with hypoxia: Secondary | ICD-10-CM | POA: Diagnosis present

## 2015-09-05 MED ORDER — PREDNISONE 20 MG PO TABS
60.0000 mg | ORAL_TABLET | Freq: Once | ORAL | Status: AC
  Administered 2015-09-06: 60 mg via ORAL
  Filled 2015-09-05: qty 3

## 2015-09-05 MED ORDER — ALBUTEROL SULFATE (2.5 MG/3ML) 0.083% IN NEBU
5.0000 mg | INHALATION_SOLUTION | Freq: Once | RESPIRATORY_TRACT | Status: AC
Start: 2015-09-06 — End: 2015-09-06
  Administered 2015-09-06: 5 mg via RESPIRATORY_TRACT
  Filled 2015-09-05: qty 6

## 2015-09-05 MED ORDER — ALBUTEROL SULFATE (2.5 MG/3ML) 0.083% IN NEBU
5.0000 mg | INHALATION_SOLUTION | Freq: Once | RESPIRATORY_TRACT | Status: AC
  Administered 2015-09-05: 5 mg via RESPIRATORY_TRACT
  Filled 2015-09-05: qty 6

## 2015-09-05 NOTE — ED Provider Notes (Signed)
CSN: [id number]     Arrival date & time 09/05/15  2237 History   First MD Initiated Contact with Patient 09/05/15 2311     Chief Complaint  Patient presents with  . Shortness of Breath     (Consider location/radiation/quality/duration/timing/severity/associated sxs/prior Treatment) Patient is a 20 y.o. female presenting with shortness of breath. The history is provided by the patient and medical records. No language interpreter was used.  Shortness of Breath Associated symptoms: wheezing   Associated symptoms: no abdominal pain, no cough, no headaches, no neck pain, no rash and no vomiting     Danielle Carlson is a 20 y.o. female  with a PMH of asthma, allergies who presents to the Emergency Department complaining of 4 day history of sob with acute worsening this morning. Denies fever, chest pain. Pt. States symptoms are similar to previous visits. Has nebulizer at home but was out of albuterol - tried water neb tx with little relief PTA.    Past Medical History  Diagnosis Date  . Asthma   . Seasonal allergies    History reviewed. No pertinent past surgical history. History reviewed. No pertinent family history. Social History  Substance Use Topics  . Smoking status: Never Smoker   . Smokeless tobacco: Never Used  . Alcohol Use: Yes   OB History    No data available     Review of Systems  Constitutional: Negative.   HENT: Negative for drooling, rhinorrhea and trouble swallowing.   Eyes: Negative for visual disturbance.  Respiratory: Positive for shortness of breath and wheezing. Negative for cough.   Cardiovascular: Negative.   Gastrointestinal: Negative for nausea, vomiting, abdominal pain, diarrhea and constipation.  Musculoskeletal: Negative for myalgias, back pain, arthralgias and neck pain.  Skin: Negative for rash.  Neurological: Negative for dizziness, weakness and headaches.  Hematological: Does not bruise/bleed easily.      Allergies  Review of patient's  allergies indicates no known allergies.  Home Medications   Prior to Admission medications   Medication Sig Start Date End Date Taking? Authorizing Provider  albuterol (PROVENTIL) (2.5 MG/3ML) 0.083% nebulizer solution Take 3 mLs (2.5 mg total) by nebulization every 4 (four) hours as needed for wheezing or shortness of breath. 12/04/14  Yes Rolan BuccoMelanie Belfi, MD  montelukast (SINGULAIR) 10 MG tablet Take 10 mg by mouth at bedtime.   Yes Historical Provider, MD  albuterol (PROVENTIL HFA;VENTOLIN HFA) 108 (90 BASE) MCG/ACT inhaler Inhale 1-2 puffs into the lungs every 6 (six) hours as needed for wheezing or shortness of breath. Patient not taking: Reported on 09/05/2015 03/19/15   Purvis SheffieldForrest Harrison, MD  cetirizine-pseudoephedrine (ZYRTEC-D) 5-120 MG per tablet Take 1 tablet by mouth daily. Patient not taking: Reported on 09/05/2015 04/29/15   Joycie PeekBenjamin Cartner, PA-C   BP 117/93 mmHg  Pulse 125  Temp(Src) 97.5 F (36.4 C) (Oral)  Resp 34  SpO2 94%  LMP 08/15/2015 (Approximate) Physical Exam  Constitutional: She is oriented to person, place, and time. She appears well-developed and well-nourished.  Alert and in no acute distress  HENT:  Head: Normocephalic and atraumatic.  Mouth/Throat: Oropharynx is clear and moist.  Cardiovascular: Normal heart sounds.  Exam reveals no gallop and no friction rub.   No murmur heard. Tachy but regular  Pulmonary/Chest: No respiratory distress. She has wheezes. She has no rales. She exhibits no tenderness.  Increased effort in breathing.   Abdominal: She exhibits no mass. There is no rebound and no guarding.  Abdomen soft, non-tender, non-distended Bowel sounds positive  in all four quadrants  Musculoskeletal: She exhibits no edema.  Neurological: She is alert and oriented to person, place, and time.  Skin: Skin is warm and dry. No rash noted.  Psychiatric: She has a normal mood and affect. Her behavior is normal. Judgment and thought content normal.  Nursing  note and vitals reviewed.   ED Course  Procedures (including critical care time) Labs Review Labs Reviewed - No data to display  Imaging Review Dg Chest 2 View  09/05/2015  CLINICAL DATA:  Shortness of breath. EXAM: CHEST  2 VIEW COMPARISON:  April 29, 2015. FINDINGS: The heart size and mediastinal contours are within normal limits. Both lungs are clear. No pneumothorax or pleural effusion is noted. The visualized skeletal structures are unremarkable. IMPRESSION: No active cardiopulmonary disease. Electronically Signed   By: Lupita Raider, M.D.   On: 09/05/2015 23:07   I have personally reviewed and evaluated these images and lab results as part of my medical decision-making.   EKG Interpretation   Date/Time:  Tuesday September 05 2015 23:01:39 EST Ventricular Rate:  99 PR Interval:  117 QRS Duration: 91 QT Interval:  308 QTC Calculation: 395 R Axis:   105 Text Interpretation:  Sinus tachycardia Borderline right axis deviation  Artifact in lead(s) I II III aVR aVL aVF V4 V5 V6 Poor data quality  Confirmed by KNAPP  MD-J, JON ([id number]) on 09/05/2015 11:09:18 PM      MDM   Final diagnoses:  None   Danielle Carlson presents with worsening sob c/w asthma exacerbation  Imaging: CXR with no acute cardiopulm dz.  Therapeutics: Prednisone 60, alb neb treatment x 2, 500 IVF bolus, 2g mag Pt. Re-evaluated, effort in breathing has improved, lungs still with harsh breath sounds and wheezing.  Care assumed by oncoming provider PA Upstill, case discussed, plan agreed upon.  Patient discussed with Dr. Verdie Mosher who agrees with treatment plan.   [facility] Ward, PA-C 09/06/15 0121  Lavera Guise, MD 09/06/15 [phone]

## 2015-09-05 NOTE — ED Notes (Signed)
Pt states that she has been having intermittent SOB especially on exertion. No URI symptoms. States she has been here for same previously. 90% O2. Alert and oriented.

## 2015-09-06 ENCOUNTER — Encounter (HOSPITAL_COMMUNITY): Payer: Self-pay

## 2015-09-06 DIAGNOSIS — J9601 Acute respiratory failure with hypoxia: Secondary | ICD-10-CM

## 2015-09-06 DIAGNOSIS — J45901 Unspecified asthma with (acute) exacerbation: Secondary | ICD-10-CM | POA: Diagnosis present

## 2015-09-06 DIAGNOSIS — J4531 Mild persistent asthma with (acute) exacerbation: Secondary | ICD-10-CM

## 2015-09-06 MED ORDER — ALBUTEROL SULFATE HFA 108 (90 BASE) MCG/ACT IN AERS: 2.0000 | INHALATION_SPRAY | RESPIRATORY_TRACT | Status: DC | PRN

## 2015-09-06 MED ORDER — ALBUTEROL SULFATE (2.5 MG/3ML) 0.083% IN NEBU: 5.0000 mg | INHALATION_SOLUTION | Freq: Four times a day (QID) | RESPIRATORY_TRACT | Status: DC | PRN

## 2015-09-06 MED ORDER — MAGNESIUM SULFATE 50 % IJ SOLN: 2.0000 g | Freq: Once | INTRAMUSCULAR | Status: DC

## 2015-09-06 MED ORDER — ALBUTEROL (5 MG/ML) CONTINUOUS INHALATION SOLN
10.0000 mg/h | INHALATION_SOLUTION | RESPIRATORY_TRACT | Status: DC
  Administered 2015-09-06: 10 mg/h via RESPIRATORY_TRACT
  Filled 2015-09-06: qty 20

## 2015-09-06 MED ORDER — ALBUTEROL SULFATE (2.5 MG/3ML) 0.083% IN NEBU
2.5000 mg | INHALATION_SOLUTION | Freq: Four times a day (QID) | RESPIRATORY_TRACT | Status: DC
  Administered 2015-09-06 – 2015-09-08 (×9): 2.5 mg via RESPIRATORY_TRACT
  Filled 2015-09-06 (×9): qty 3

## 2015-09-06 MED ORDER — SODIUM CHLORIDE 0.9 % IV BOLUS (SEPSIS)
500.0000 mL | Freq: Once | INTRAVENOUS | Status: AC
  Administered 2015-09-06: 500 mL via INTRAVENOUS

## 2015-09-06 MED ORDER — ONDANSETRON HCL 4 MG PO TABS: 4.0000 mg | ORAL_TABLET | Freq: Four times a day (QID) | ORAL | Status: DC | PRN

## 2015-09-06 MED ORDER — IPRATROPIUM BROMIDE 0.02 % IN SOLN
0.5000 mg | Freq: Once | RESPIRATORY_TRACT | Status: AC
  Administered 2015-09-06: 0.5 mg via RESPIRATORY_TRACT
  Filled 2015-09-06: qty 2.5

## 2015-09-06 MED ORDER — PREDNISONE 10 MG PO TABS: ORAL_TABLET | ORAL | Status: DC

## 2015-09-06 MED ORDER — ALBUTEROL SULFATE (2.5 MG/3ML) 0.083% IN NEBU: 2.5000 mg | INHALATION_SOLUTION | RESPIRATORY_TRACT | Status: DC | PRN

## 2015-09-06 MED ORDER — ONDANSETRON HCL 4 MG/2ML IJ SOLN
4.0000 mg | Freq: Four times a day (QID) | INTRAMUSCULAR | Status: DC | PRN
  Administered 2015-09-06: 4 mg via INTRAVENOUS
  Filled 2015-09-06: qty 2

## 2015-09-06 MED ORDER — GUAIFENESIN ER 600 MG PO TB12
600.0000 mg | ORAL_TABLET | Freq: Two times a day (BID) | ORAL | Status: DC
  Administered 2015-09-06 – 2015-09-08 (×5): 600 mg via ORAL
  Filled 2015-09-06 (×5): qty 1

## 2015-09-06 MED ORDER — ALBUTEROL (5 MG/ML) CONTINUOUS INHALATION SOLN
10.0000 mg/h | INHALATION_SOLUTION | Freq: Once | RESPIRATORY_TRACT | Status: AC
  Administered 2015-09-06: 10 mg/h via RESPIRATORY_TRACT

## 2015-09-06 MED ORDER — MONTELUKAST SODIUM 10 MG PO TABS
10.0000 mg | ORAL_TABLET | Freq: Every day | ORAL | Status: DC
  Administered 2015-09-06 – 2015-09-07 (×2): 10 mg via ORAL
  Filled 2015-09-06 (×3): qty 1

## 2015-09-06 MED ORDER — SODIUM CHLORIDE 0.9 % IJ SOLN
3.0000 mL | Freq: Two times a day (BID) | INTRAMUSCULAR | Status: DC
  Administered 2015-09-06 – 2015-09-08 (×5): 3 mL via INTRAVENOUS

## 2015-09-06 MED ORDER — MAGNESIUM SULFATE 2 GM/50ML IV SOLN
2.0000 g | Freq: Once | INTRAVENOUS | Status: AC
  Administered 2015-09-06: 2 g via INTRAVENOUS
  Filled 2015-09-06: qty 50

## 2015-09-06 MED ORDER — METHYLPREDNISOLONE SODIUM SUCC 40 MG IJ SOLR
40.0000 mg | Freq: Two times a day (BID) | INTRAMUSCULAR | Status: DC
  Administered 2015-09-06 – 2015-09-08 (×5): 40 mg via INTRAVENOUS
  Filled 2015-09-06 (×5): qty 1

## 2015-09-06 NOTE — ED Notes (Signed)
Pt had an episode of vomiting

## 2015-09-06 NOTE — ED Notes (Signed)
Called respiratory to bedside for treatment. 

## 2015-09-06 NOTE — H&P (Signed)
[facility] History and Physical  Danielle Carlson WUJ:[id number] DOB: 09/07/1995 DOA: 09/05/2015  Referring physician: ED physician, Dr. Rhunette Croft  PCP: [facility]   Chief Complaint: dyspnea   HPI:  Patient is very pleasant 20 year old female with known history of asthma, presents to [facility] emergency department with main concern of several days duration of progressively worsening shortness of breath that initially started with exertion and has progressed to dyspnea at rest in the past 24 hours. This has been associated with wheezing, nonproductive coughing spells. Patient reports similar events in the past that were due to asthma exacerbation but denies history of intubations. She denies fevers and chills, no recent sick contacts or exposures. She also denies chest pain, abdominal or urinary concerns.  In emergency department, patient was hemodynamically stable, vital signs notable for heart rate up to 145 after nebulizer treatment, respiratory rate initially in 30s but improved to 22-25 breaths per minute after nebulizer treatment, initial oxygen saturation 89% on room air. [facility] asked to admit for further evaluation.  Assessment and Plan:  Principal Problem:   Acute respiratory failure with hypoxia (HCC) due to acute asthma exacerbation  - Patient still wheezing on exam after nebulizer treatment necessitating admission for observation and further inpatient treatment - Admit to telemetry bed - Placed on bronchodilators scheduled and as needed, add antitussives - Keep on oxygen via nasal cannula to maintain saturations above 92% - Add Solu-Medrol 40 mg every 12 hours and taper down as clinically indicated  Active Problems:   Tachycardia - Secondary to bronchodilators effect - Monitor on telemetry for 24 hours - Would not add metoprolol at this time  SCD's for DVT prophylaxis   Radiological Exams on Admission: Dg Chest 2 View 09/05/2015  No active  cardiopulmonary disease.   Code Status: Full Family Communication: Pt at bedside Disposition Plan: Admit for further evaluation    Danielle Carlson [facility] [phone]  Review of Systems:  Constitutional: Negative for fever, chills and malaise/fatigue. Negative for diaphoresis.  HENT: Negative for hearing loss, ear pain, nosebleeds, congestion, sore throat, neck pain, tinnitus and ear discharge.   Eyes: Negative for blurred vision, double vision, photophobia, pain, discharge and redness.  Respiratory: Per HPI  Cardiovascular: Negative for chest pain, palpitations, orthopnea, claudication and leg swelling.  Gastrointestinal: Negative for nausea, vomiting and abdominal pain. Negative for heartburn, constipation, blood in stool and melena.  Genitourinary: Negative for dysuria, urgency, frequency, hematuria and flank pain.  Musculoskeletal: Negative for myalgias, back pain, joint pain and falls.  Skin: Negative for itching and rash.  Neurological: Negative for tingling, tremors, sensory change, speech change, focal weakness, loss of consciousness and headaches.  Endo/Heme/Allergies: Negative for environmental allergies and polydipsia. Does not bruise/bleed easily.  Psychiatric/Behavioral: Negative for suicidal ideas. The patient is not nervous/anxious.      Past Medical History  Diagnosis Date  . Asthma   . Seasonal allergies     Social History:  reports that she has never smoked. She has never used smokeless tobacco. She reports that she drinks alcohol. She reports that she uses illicit drugs (Marijuana).  No Known Allergies  No family history of HTN, DM  Medication Sig  montelukast (SINGULAIR) 10 MG tablet Take 10 mg by mouth at bedtime.  albuterol (PROVENTIL) (2.5 MG/3ML) 0.083% nebulizer solution Take 6 mLs (5 mg total) by nebulization every 6 (six) hours as needed for wheezing or shortness of breath.  cetirizine-pseudoephedrine (ZYRTEC-D) 5-120 MG per tablet Take 1 tablet by mouth  daily. Patient not taking: Reported on 09/05/2015  predniSONE (DELTASONE) 10 MG tablet Take 5 tablets on day 2 (Day 1 provided in the emergency department) Take 4 tablets on day 3 Take 3 tablets on day 4 Take 2 tablets on day 5 Take 1 tablet on day 6    Physical Exam: Filed Vitals:   09/06/15 0436 09/06/15 0554 09/06/15 0631 09/06/15 0731  BP:   119/82 116/68  Pulse: 137  139 135  Temp:      TempSrc:      Resp:   23 22  SpO2: 93% 93% 99% 92%    Physical Exam  Constitutional: Appears well-developed and well-nourished. No distress.  HENT: Normocephalic. External right and left ear normal. Oropharynx is clear and moist.  Eyes: Conjunctivae and EOM are normal. PERRLA, no scleral icterus.  Neck: Normal ROM. Neck supple. No JVD. No tracheal deviation. No thyromegaly.  CVS: RRR, S1/S2 +, no murmurs, no gallops, no carotid bruit.  Pulmonary: Effort and breath sounds normal, exp wheezing in lower lobes with diminished breath sounds at bases  Abdominal: Soft. BS +,  no distension, tenderness, rebound or guarding.  Musculoskeletal: Normal range of motion. No edema and no tenderness.  Lymphadenopathy: No lymphadenopathy noted, cervical, inguinal. Neuro: Alert. Normal reflexes, muscle tone coordination. No cranial nerve deficit. Skin: Skin is warm and dry. No rash noted. Not diaphoretic. No erythema. No pallor.  Psychiatric: Normal mood and affect. Behavior, judgment, thought content normal.   Labs on Admission:  Basic Metabolic Panel: No results for input(s): NA, K, CL, CO2, GLUCOSE, BUN, CREATININE, CALCIUM, MG, PHOS in the last 168 hours. Liver Function Tests: No results for input(s): AST, ALT, ALKPHOS, BILITOT, PROT, ALBUMIN in the last 168 hours. No results for input(s): LIPASE, AMYLASE in the last 168 hours. No results for input(s): AMMONIA in the last 168 hours. CBC: No results for input(s): WBC, NEUTROABS, HGB, HCT, MCV, PLT in the last 168 hours. Cardiac Enzymes: No results for  input(s): CKTOTAL, CKMB, CKMBINDEX, TROPONINI in the last 168 hours. BNP: Invalid input(s): POCBNP CBG: No results for input(s): GLUCAP in the last 168 hours.  EKG: pending    If 7PM-7AM, please contact night-coverage [email or web address] Password [email or web address] 09/06/2015, 9:50 AM

## 2015-09-06 NOTE — ED Provider Notes (Signed)
Patient signed out of end of shift by [facility] Hospital[facility] Pilcher Ward, PA-C. She presented with wheezing and SOB with history of asthma x 4 days. No fever. She reports night sweats and hot/cold chills. She also reports chest congestion with productive cough and post-tussive emesis x 1.   She has a history of multiple admissions, "every 2-3 months", without history of intubation. She is out of her nebulizer medication for home use.   At the time of sign out she has had 2 nebulizer treatments with Albuterol with prednisone, magnesium and 3rd treatment ordered. CAT ordered by me with 10 mg Albuterol and 0.5 mg Atrovent. CXR reviewed and is clear of infiltrates. Will reassess after CAT to determine disposition.  Patient continues to have wheezing. No hypoxia. She ambulates with O2 saturation between 91% and 95% and feels "a little SOB". Discussed with Dr. Rhunette Croftnanavati who will see the patient after repeat CAT to determine disposition.   Elpidio AnisShari Viviene Thurston, PA-C 09/09/15 2247

## 2015-09-06 NOTE — ED Provider Notes (Signed)
  Physical Exam  BP 119/82 mmHg  Pulse 139  Temp(Src) 97.5 F (36.4 C) (Oral)  Resp 23  SpO2 99%  LMP 08/15/2015 (Approximate)  Physical Exam  ED Course  Procedures  MDM  Pt comes in with wheezing. Hx of asthma. No meds at home. Diffuse wheezing. S/p breathing tx x 3, still wheezing. Xrays are neg for acute process.  i reassessed the patient. Pt's O2 sats 92% are rest and with ambulation around the same. Tight breath sounds.  Placed pt on hour long treatment.  Reassessed now at 7 am.  Repeat exam reveals clearing of wheezing in all lung fields. Patient is not in any respiratory distress nor is there hypoxia. She still has wheezing diffusely, but has better aeration. Strict ER return precautions discussed.  CRITICAL CARE Performed by: Derwood KaplanNanavati, Berthe Oley   Total critical care time: 38  minutes  Critical care time was exclusive of separately billable procedures and treating other patients.  Critical care was necessary to treat or prevent imminent or life-threatening deterioration.  Critical care was time spent personally by me on the following activities: development of treatment plan with patient and/or surrogate as well as nursing, discussions with consultants, evaluation of patient's response to treatment, examination of patient, obtaining history from patient or surrogate, ordering and performing treatments and interventions, ordering and review of laboratory studies, ordering and review of radiographic studies, pulse oximetry and re-evaluation of patient's condition.      Derwood KaplanAnkit Faelyn Sigler, MD 09/06/15 [phone]

## 2015-09-06 NOTE — ED Notes (Signed)
Pt ambulated with pulse ox being measured. Pt's pulse ox remained between 91-95%. Pt stated she felt "a little winded after walking".

## 2015-09-06 NOTE — Discharge Instructions (Signed)
Asthma, Adult °Asthma is a condition of the lungs in which the airways tighten and narrow. Asthma can make it hard to breathe. Asthma cannot be cured, but medicine and lifestyle changes can help control it. Asthma may be started (triggered) by: °· Animal skin flakes (dander). °· Dust. °· Cockroaches. °· Pollen. °· Mold. °· Smoke. °· Cleaning products. °· Hair sprays or aerosol sprays. °· Paint fumes or strong smells. °· Cold air, weather changes, and winds. °· Crying or laughing hard. °· Stress. °· Certain medicines or drugs. °· Foods, such as dried fruit, potato chips, and sparkling grape juice. °· Infections or conditions (colds, flu). °· Exercise. °· Certain medical conditions or diseases. °· Exercise or tiring activities. °HOME CARE  °· Take medicine as told by your doctor. °· Use a peak flow meter as told by your doctor. A peak flow meter is a tool that measures how well the lungs are working. °· Record and keep track of the peak flow meter's readings. °· Understand and use the asthma action plan. An asthma action plan is a written plan for taking care of your asthma and treating your attacks. °· To help prevent asthma attacks: °· Do not smoke. Stay away from secondhand smoke. °· Change your heating and air conditioning filter often. °· Limit your use of fireplaces and wood stoves. °· Get rid of pests (such as roaches and mice) and their droppings. °· Throw away plants if you see mold on them. °· Clean your floors. Dust regularly. Use cleaning products that do not smell. °· Have someone vacuum when you are not home. Use a vacuum cleaner with a HEPA filter if possible. °· Replace carpet with wood, tile, or vinyl flooring. Carpet can trap animal skin flakes and dust. °· Use allergy-proof pillows, mattress covers, and box spring covers. °· Wash bed sheets and blankets every week in hot water and dry them in a dryer. °· Use blankets that are made of polyester or cotton. °· Clean bathrooms and kitchens with bleach.  If possible, have someone repaint the walls in these rooms with mold-resistant paint. Keep out of the rooms that are being cleaned and painted. °· Wash hands often. °GET HELP IF: °· You have make a whistling sound when breaking (wheeze), have shortness of breath, or have a cough even if taking medicine to prevent attacks. °· The colored mucus you cough up (sputum) is thicker than usual. °· The colored mucus you cough up changes from clear or white to yellow, green, gray, or bloody. °· You have problems from the medicine you are taking such as: °· A rash. °· Itching. °· Swelling. °· Trouble breathing. °· You need reliever medicines more than 2-3 times a week. °· Your peak flow measurement is still at 50-79% of your personal best after following the action plan for 1 hour. °· You have a fever. °GET HELP RIGHT AWAY IF:  °· You seem to be worse and are not responding to medicine during an asthma attack. °· You are short of breath even at rest. °· You get short of breath when doing very little activity. °· You have trouble eating, drinking, or talking. °· You have chest pain. °· You have a fast heartbeat. °· Your lips or fingernails start to turn blue. °· You are light-headed, dizzy, or faint. °· Your peak flow is less than 50% of your personal best. °  °This information is not intended to replace advice given to you by your health care provider. Make sure   you discuss any questions you have with your health care provider.   Document Released: 01/29/2008 Document Revised: 05/03/2015 Document Reviewed: 03/11/2013 Elsevier Interactive Patient Education 2016 [place] Prevention While you may not be able to control the fact that you have asthma, you can take actions to prevent asthma attacks. The best way to prevent asthma attacks is to maintain good control of your asthma. You can achieve this by:  Taking your medicines as directed.  Avoiding things that can irritate your airways or make your  asthma symptoms worse (asthma triggers).  Keeping track of how well your asthma is controlled and of any changes in your symptoms.  Responding quickly to worsening asthma symptoms (asthma attack).  Seeking emergency care when it is needed. WHAT ARE SOME WAYS TO PREVENT AN ASTHMA ATTACK? Have a Plan Work with your health care provider to create a written plan for managing and treating your asthma attacks (asthma action plan). This plan includes:  A list of your asthma triggers and how you can avoid them.  Information on when medicines should be taken and when their dosages should be changed.  The use of a device that measures how well your lungs are working (peak flow meter). Monitor Your Asthma Use your peak flow meter and record your results in a journal every day. A drop in your peak flow numbers on one or more days may indicate the start of an asthma attack. This can happen even before you start to feel symptoms. You can prevent an asthma attack from getting worse by following the steps in your asthma action plan. Avoid Asthma Triggers Work with your asthma health care provider to find out what your asthma triggers are. This can be done by:  Allergy testing.  Keeping a journal that notes when asthma attacks occur and the factors that may have contributed to them.  Determining if there are other medical conditions that are making your asthma worse. Once you have determined your asthma triggers, take steps to avoid them. This may include avoiding excessive or prolonged exposure to:  Dust. Have someone dust and vacuum your home for you once or twice a week. Using a high-efficiency particulate arrestance (HEPA) vacuum is best.  Smoke. This includes campfire smoke, forest fire smoke, and secondhand smoke from tobacco products.  Pet dander. Avoid contact with animals that you know you are allergic to.  Allergens from trees, grasses or pollens. Avoid spending a lot of time outdoors when  pollen counts are high, and on very windy days.  Very cold, dry, or humid air.  Mold.  Foods that contain high amounts of sulfites.  Strong odors.  Outdoor air pollutants, such as Lexicographer.  Indoor air pollutants, such as aerosol sprays and fumes from household cleaners.  Household pests, including dust mites and cockroaches, and pest droppings.  Certain medicines, including NSAIDs. Always talk to your health care provider before stopping or starting any new medicines. Medicines Take over-the-counter and prescription medicines only as told by your health care provider. Many asthma attacks can be prevented by carefully following your medicine schedule. Taking your medicines correctly is especially important when you cannot avoid certain asthma triggers. Act Quickly If an asthma attack does happen, acting quickly can decrease how severe it is and how long it lasts. Take these steps:   Pay attention to your symptoms. If you are coughing, wheezing, or having difficulty breathing, do not wait to see if your symptoms go away on their  own. Follow your asthma action plan. °· If you have followed your asthma action plan and your symptoms are not improving, call your health care provider or seek immediate medical care at the nearest hospital. °It is important to note how often you need to use your fast-acting rescue inhaler. If you are using your rescue inhaler more often, it may mean that your asthma is not under control. Adjusting your asthma treatment plan may help you to prevent future asthma attacks and help you to gain better control of your condition. °HOW CAN I PREVENT AN ASTHMA ATTACK WHEN I EXERCISE? °Follow advice from your health care provider about whether you should use your fast-acting inhaler before exercising. Many people with asthma experience exercise-induced bronchoconstriction (EIB). This condition often worsens during vigorous exercise in cold, humid, or dry environments.  Usually, people with EIB can stay very active by pre-treating with a fast-acting inhaler before exercising. °  °This information is not intended to replace advice given to you by your health care provider. Make sure you discuss any questions you have with your health care provider. °  °Document Released: 07/31/2009 Document Revised: 05/03/2015 Document Reviewed: 01/12/2015 °Elsevier Interactive Patient Education ©2016 Elsevier Inc. ° °

## 2015-09-06 NOTE — ED Notes (Signed)
After discharging patient, she was unable to walk from stretcher to chair to get dressed. Notified MD, who is now admitting the patient.

## 2015-09-07 DIAGNOSIS — J45901 Unspecified asthma with (acute) exacerbation: Secondary | ICD-10-CM | POA: Diagnosis present

## 2015-09-07 DIAGNOSIS — J4521 Mild intermittent asthma with (acute) exacerbation: Secondary | ICD-10-CM

## 2015-09-07 LAB — CBC
HCT: 41.2 % (ref 36.0–46.0)
Hemoglobin: 13.5 g/dL (ref 12.0–15.0)
MCH: 25 pg — ABNORMAL LOW (ref 26.0–34.0)
MCHC: 32.8 g/dL (ref 30.0–36.0)
MCV: 76.2 fL — ABNORMAL LOW (ref 78.0–100.0)
PLATELETS: 293 10*3/uL (ref 150–400)
RBC: 5.41 MIL/uL — ABNORMAL HIGH (ref 3.87–5.11)
RDW: 13.8 % (ref 11.5–15.5)
WBC: 16.8 10*3/uL — AB (ref 4.0–10.5)

## 2015-09-07 LAB — BASIC METABOLIC PANEL
Anion gap: 11 (ref 5–15)
BUN: 15 mg/dL (ref 6–20)
CO2: 22 mmol/L (ref 22–32)
CREATININE: 0.8 mg/dL (ref 0.44–1.00)
Calcium: 10.3 mg/dL (ref 8.9–10.3)
Chloride: 106 mmol/L (ref 101–111)
GFR calc Af Amer: >60 mL/min (ref >60)
GLUCOSE: 134 mg/dL — AB (ref 65–99)
Potassium: 4.4 mmol/L (ref 3.5–5.1)
SODIUM: 139 mmol/L (ref 135–145)

## 2015-09-07 NOTE — Progress Notes (Signed)
  PROGRESS NOTE  Danielle Carlson ZHY:[id number] DOB: 11/25/1995 DOA: 09/05/2015 PCP: [facility]  HPI: Danielle Carlson is a 20 year old female with past medical history of mild asthma and allergies, who presented with chief complaint of shortness of breath and wheezing, admitted with asthma exacerbation  Subjective / 24 H Interval events The patient is feeling improved today, she denies chest pain, dyspnea or abdominal pain. She noted episodes of emesis and loose stools yesterday which have since resolved.  Assessment/Plan: Principal Problem:   Acute respiratory failure with hypoxia (HCC) Active Problems:   Tachycardia   Asthma   Acute asthma exacerbation   Acute respiratory failure with hypoxia (HCC) due to acute asthma exacerbation  - Still notable wheezing on exam but oxygenating well on room air - Continue albuterol scheduled/PRN, mucinex - Continue Solu-Medrol for now, taper upon discharge - Encouraged ambulation today  Sinus Tachycardia -Secondary to bronchodilators effect, improved, d/c telemetry   Allergies - Continue Singulair    Diet: Regular  Fluids: None DVT Prophylaxis: SCDs  Code Status: Full Code Family Communication: None at bedside Disposition Plan: Home Barriers to discharge: Likely home tomorrow if she continues to progress and wheezing improved.  Consultants:  None  Procedures:  None   Antibiotics None   Studies  Dg Chest 2 View  09/05/2015  CLINICAL DATA:  Shortness of breath. EXAM: CHEST  2 VIEW COMPARISON:  April 29, 2015. FINDINGS: The heart size and mediastinal contours are within normal limits. Both lungs are clear. No pneumothorax or pleural effusion is noted. The visualized skeletal structures are unremarkable. IMPRESSION: No active cardiopulmonary disease. Electronically Signed   By: Lupita RaiderJames  Green Jr, M.D.   On: 09/05/2015 23:07    Objective  Filed Vitals:   09/06/15 2249 09/07/15 0146 09/07/15 0653  09/07/15 0911  BP: 107/75  116/78   Pulse: 103  108   Temp: 98.3 F (36.8 C)  98.2 F (36.8 C)   TempSrc: Oral  Oral   Resp: 20  20   SpO2: 96% 93% 95% 96%    Intake/Output Summary (Last 24 hours) at 09/07/15 1258 Last data filed at 09/07/15 0900  Gross per 24 hour  Intake    990 ml  Output      0 ml  Net    990 ml   Exam:  GENERAL: NAD, clear speech  HEENT: no scleral icterus, PERRL  NECK: supple  LUNGS: wheezing bilaterally, no conversational dyspnea  HEART: RRR without MRG  ABDOMEN: soft, non tender, nondistended  EXTREMITIES: no clubbing / cyanosis, no edema  NEUROLOGIC: non focal   Data Reviewed: Basic Metabolic Panel:  Recent Labs Lab 09/07/15 0455  NA 139  K 4.4  CL 106  CO2 22  GLUCOSE 134*  BUN 15  CREATININE 0.80  CALCIUM 10.3    CBC:  Recent Labs Lab 09/07/15 0455  WBC 16.8*  HGB 13.5  HCT 41.2  MCV 76.2*  PLT 293    Scheduled Meds: . albuterol  2.5 mg Nebulization Q6H  . guaiFENesin  600 mg Oral BID  . methylPREDNISolone (SOLU-MEDROL) injection  40 mg Intravenous Q12H  . montelukast  10 mg Oral QHS  . sodium chloride  3 mL Intravenous Q12H   Continuous Infusions: . albuterol Stopped (09/06/15 0358)     Pamella Pertostin Darin Arndt, MD [facility] Pager [phone]. If 7 PM - 7 AM, please contact night-coverage at [email or web address], password [email or web address] 09/07/2015, 12:58 PM

## 2015-09-08 MED ORDER — ALBUTEROL SULFATE (2.5 MG/3ML) 0.083% IN NEBU: 2.5000 mg | INHALATION_SOLUTION | Freq: Four times a day (QID) | RESPIRATORY_TRACT | Status: DC

## 2015-09-08 MED ORDER — PREDNISONE 10 MG PO TABS: ORAL_TABLET | ORAL | Status: DC

## 2015-09-08 MED ORDER — ALBUTEROL SULFATE HFA 108 (90 BASE) MCG/ACT IN AERS: 2.0000 | INHALATION_SPRAY | Freq: Four times a day (QID) | RESPIRATORY_TRACT | Status: DC | PRN

## 2015-09-08 MED ORDER — GUAIFENESIN ER 600 MG PO TB12: 600.0000 mg | ORAL_TABLET | Freq: Two times a day (BID) | ORAL | Status: DC | PRN

## 2015-09-08 NOTE — Progress Notes (Signed)
Patient ambulated entire length of unit, O2 sat 100% on room air at rest, with ambulation varied from 97 to 100% on room air.  Heart rate tachy from 100 resting to as high as 122 with ambulation.  Denied any shortness of breath.

## 2015-09-08 NOTE — Progress Notes (Signed)
[facility].  Patient without insurance.  [facility]  [facility] spoke to patient at bedside.  EDCM instructed patient to fill her prescriptions within seven days otherwise, MATCH letter will expire.  [facility] also informed [facility] that she will not be eligible for this assistance until this time next year.  Patient verbalized understanding.  MATCH letter provided to patient.  No further EDCM needs at this time.

## 2015-09-08 NOTE — Discharge Summary (Signed)
Physician Discharge Summary  Danielle Carlson ZOX:[id number] DOB: Jul 05, 1996 DOA: 09/05/2015  PCP: [facility]  Admit date: 09/05/2015 Discharge date: 09/08/2015  Time spent: < 30 minutes  Recommendations for Outpatient Follow-up:  1. Follow up with PCP in 1-2 weeks   Discharge Diagnoses:  Principal Problem:   Acute respiratory failure with hypoxia (HCC) Active Problems:   Tachycardia   Asthma   Acute asthma exacerbation   Asthma exacerbation  Discharge Condition: stable  Diet recommendation: regular  There were no vitals filed for this visit.  History of present illness:  See H&P, Labs, Consult and Test reports for all details in brief, patient is a 20 year old female with past medical history of mild asthma and allergies, who presented with chief complaint of shortness of breath and wheezing, admitted with asthma exacerbation  Hospital Course:  Patient was admitted to the hospital with acute respiratory failure with hypoxia due to acute asthma exacerbation. She was started on IV steroids and scheduled nebulizers, her wheezing has slowly resolved, and on the day of discharge she was stable on room air, able to ambulate without any further dyspnea, no wheezing and no further issues. She was discharged home in stable condition, all her home medications were refilled as she almost ran out,  and also was given a prednisone taper on discharge. She was mildly tachycardic on admission, no arrhythmias showing only sinus tachycardia, likely due to the breathing treatments, resolved.  Procedures:  None    Consultations:  None   Discharge Exam: Filed Vitals:   09/08/15 0150 09/08/15 0848 09/08/15 1357 09/08/15 1500  BP: 113/80   107/62  Pulse: 92   90  Temp: 97.7 F (36.5 C)   98.4 F (36.9 C)  TempSrc: Oral   Oral  Resp: 20     SpO2: 99% 98% 95% 95%    General: NAD Cardiovascular: RRR Respiratory: CTA biL, no wheezing  Discharge  Instructions Activity:  As tolerated   Get Medicines reviewed and adjusted: Please take all your medications with you for your next visit with your Primary MD  Please request your Primary MD to go over all hospital tests and procedure/radiological results at the follow up, please ask your Primary MD to get all Hospital records sent to his/her office.  If you experience worsening of your admission symptoms, develop shortness of breath, life threatening emergency, suicidal or homicidal thoughts you must seek medical attention immediately by calling 911 or calling your MD immediately if symptoms less severe.  You must read complete instructions/literature along with all the possible adverse reactions/side effects for all the Medicines you take and that have been prescribed to you. Take any new Medicines after you have completely understood and accpet all the possible adverse reactions/side effects.   Do not drive when taking Pain medications.   Do not take more than prescribed Pain, Sleep and Anxiety Medications  Special Instructions: If you have smoked or chewed Tobacco in the last 2 yrs please stop smoking, stop any regular Alcohol and or any Recreational drug use.  Wear Seat belts while driving.  Please note  You were cared for by a hospitalist during your hospital stay. Once you are discharged, your primary care physician will handle any further medical issues. Please note that NO REFILLS for any discharge medications will be authorized once you are discharged, as it is imperative that you return to your primary care physician (or establish a relationship with a primary care physician if you  do not have one) for your aftercare needs so that they can reassess your need for medications and monitor your lab values.    Medication List    STOP taking these medications        albuterol 108 (90 Base) MCG/ACT inhaler  Commonly known as:  PROVENTIL HFA;VENTOLIN HFA  Replaced by:   albuterol (2.5 MG/3ML) 0.083% nebulizer solution  You also have another medication with the same name that you need to continue taking as instructed.      TAKE these medications        albuterol (2.5 MG/3ML) 0.083% nebulizer solution  Commonly known as:  PROVENTIL  Take 6 mLs (5 mg total) by nebulization every 6 (six) hours as needed for wheezing or shortness of breath.     albuterol (2.5 MG/3ML) 0.083% nebulizer solution  Commonly known as:  PROVENTIL  Take 3 mLs (2.5 mg total) by nebulization every 6 (six) hours.     albuterol 108 (90 Base) MCG/ACT inhaler  Commonly known as:  PROVENTIL HFA;VENTOLIN HFA  Inhale 2 puffs into the lungs every 6 (six) hours as needed for wheezing or shortness of breath.     cetirizine-pseudoephedrine 5-120 MG tablet  Commonly known as:  ZYRTEC-D  Take 1 tablet by mouth daily.     guaiFENesin 600 MG 12 hr tablet  Commonly known as:  MUCINEX  Take 1 tablet (600 mg total) by mouth 2 (two) times daily as needed.     montelukast 10 MG tablet  Commonly known as:  SINGULAIR  Take 10 mg by mouth at bedtime.     predniSONE 10 MG tablet  Commonly known as:  DELTASONE  Take 4 tablets for 2 days then  Take 3 tablets for 2 days then Take 2 tablets for 2 days then Take 1 tablet for 2 days then stop. Take 1 tablet on day 6           Follow-up Information    Follow up with [facility].   Contact information:   [address] [place] [id number] [phone]      The results of significant diagnostics from this hospitalization (including imaging, microbiology, ancillary and laboratory) are listed below for reference.    Significant Diagnostic Studies: Dg Chest 2 View  09/05/2015  CLINICAL DATA:  Shortness of breath. EXAM: CHEST  2 VIEW COMPARISON:  April 29, 2015. FINDINGS: The heart size and mediastinal contours are within normal limits. Both lungs are clear. No pneumothorax or pleural effusion is  noted. The visualized skeletal structures are unremarkable. IMPRESSION: No active cardiopulmonary disease. Electronically Signed   By: Lupita RaiderJames  Green Jr, M.D.   On: 09/05/2015 23:07   Labs: Basic Metabolic Panel:  Recent Labs Lab 09/07/15 0455  NA 139  K 4.4  CL 106  CO2 22  GLUCOSE 134*  BUN 15  CREATININE 0.80  CALCIUM 10.3   CBC:  Recent Labs Lab 09/07/15 0455  WBC 16.8*  HGB 13.5  HCT 41.2  MCV 76.2*  PLT 293   Signed:  Deng Kemler  [facility] 09/08/2015, 6:00 PM

## 2015-09-11 NOTE — Progress Notes (Signed)
Discharge instructions reviewed with patient, questions answered verbalized understanding.  Stressed to patient the importance of taking all her steroids as prescribed as well as her other medications.  Patient voiced understanding and agreement.  Transported to front of hospital via wheelchair to be taken home by her mother.

## 2016-04-17 ENCOUNTER — Emergency Department (HOSPITAL_COMMUNITY)
Admission: EM | Admit: 2016-04-17 | Discharge: 2016-04-17 | Disposition: A | Discharge status: Home / Self Care | Payer: Medicaid Other | Attending: Emergency Medicine | Admitting: Emergency Medicine

## 2016-04-17 ENCOUNTER — Emergency Department (HOSPITAL_COMMUNITY): Payer: Medicaid Other

## 2016-04-17 ENCOUNTER — Encounter (HOSPITAL_COMMUNITY): Payer: Self-pay

## 2016-04-17 DIAGNOSIS — J302 Other seasonal allergic rhinitis: Secondary | ICD-10-CM

## 2016-04-17 DIAGNOSIS — F1290 Cannabis use, unspecified, uncomplicated: Secondary | ICD-10-CM | POA: Insufficient documentation

## 2016-04-17 DIAGNOSIS — J45901 Unspecified asthma with (acute) exacerbation: Secondary | ICD-10-CM | POA: Insufficient documentation

## 2016-04-17 HISTORY — DX: Pneumonia, unspecified organism: J18.9

## 2016-04-17 MED ORDER — SODIUM CHLORIDE 0.9 % IV BOLUS (SEPSIS)
1000.0000 mL | Freq: Once | INTRAVENOUS | Status: AC
  Administered 2016-04-17: 1000 mL via INTRAVENOUS

## 2016-04-17 MED ORDER — METHYLPREDNISOLONE SODIUM SUCC 125 MG IJ SOLR
125.0000 mg | Freq: Once | INTRAMUSCULAR | Status: AC
  Administered 2016-04-17: 125 mg via INTRAVENOUS
  Filled 2016-04-17: qty 2

## 2016-04-17 MED ORDER — IPRATROPIUM-ALBUTEROL 0.5-2.5 (3) MG/3ML IN SOLN
3.0000 mL | Freq: Once | RESPIRATORY_TRACT | Status: AC
Start: 2016-04-17 — End: 2016-04-17
  Administered 2016-04-17: 3 mL via RESPIRATORY_TRACT
  Filled 2016-04-17: qty 3

## 2016-04-17 MED ORDER — BENZONATATE 100 MG PO CAPS: 100.0000 mg | ORAL_CAPSULE | Freq: Three times a day (TID) | ORAL | 0 refills | Status: DC

## 2016-04-17 MED ORDER — ALBUTEROL (5 MG/ML) CONTINUOUS INHALATION SOLN
10.0000 mg/h | INHALATION_SOLUTION | Freq: Once | RESPIRATORY_TRACT | Status: AC
  Administered 2016-04-17: 10 mg/h via RESPIRATORY_TRACT
  Filled 2016-04-17: qty 20

## 2016-04-17 MED ORDER — ALBUTEROL SULFATE (2.5 MG/3ML) 0.083% IN NEBU
5.0000 mg | INHALATION_SOLUTION | Freq: Once | RESPIRATORY_TRACT | Status: DC
  Filled 2016-04-17: qty 6

## 2016-04-17 MED ORDER — ALBUTEROL SULFATE HFA 108 (90 BASE) MCG/ACT IN AERS
2.0000 | INHALATION_SPRAY | Freq: Once | RESPIRATORY_TRACT | Status: AC
  Administered 2016-04-17: 2 via RESPIRATORY_TRACT
  Filled 2016-04-17: qty 6.7

## 2016-04-17 MED ORDER — ALBUTEROL SULFATE HFA 108 (90 BASE) MCG/ACT IN AERS: 2.0000 | INHALATION_SPRAY | RESPIRATORY_TRACT | 1 refills | Status: DC | PRN

## 2016-04-17 NOTE — ED Provider Notes (Signed)
[facility]-EMERGENCY DEPT Provider Note   CSN: [id number] Arrival date & time: 04/17/16  1128     History   Chief Complaint Chief Complaint  Patient presents with  . Allergies  . Shortness of Breath    HPI Danielle Carlson is a 20 y.o. female.  HPI   Danielle Carlson is a 19 y.o. female, with a history of asthma, presenting to the ED with a productive cough with white sputum, shortness of breath, and nasal congestion for the past two weeks, worse over the last two days. Pt has not used her home albuterol because she no longer has face mask. States she has been having trouble with increased nasal congestion prior to the coughing and shortness of breath. Endorses hot/cold flashes. Pt denies chest pain, N/V, abdominal pain, or any other complaints.  Patient states she is still taking her Singulair but no longer takes Zyrtec-D.   Past Medical History:  Diagnosis Date  . Asthma   . Pneumonia   . Seasonal allergies     Patient Active Problem List   Diagnosis Date Noted  . Asthma exacerbation 09/07/2015  . Acute asthma exacerbation 09/06/2015  . Status asthmaticus 08/14/2014  . Nausea vomiting and diarrhea 08/14/2014  . Asthma   . Seasonal allergies   . Acute respiratory failure with hypoxia (HCC) 02/24/2013  . History of asthma 02/24/2013  . Tachycardia 02/24/2013  . CAP (community acquired pneumonia) 02/24/2013  . Asthma with acute exacerbation 02/24/2013    History reviewed. No pertinent surgical history.  OB History    No data available       Home Medications    Prior to Admission medications   Medication Sig Start Date End Date Taking? Authorizing Provider  montelukast (SINGULAIR) 10 MG tablet Take 10 mg by mouth at bedtime.   Yes Historical Provider, MD  albuterol (PROVENTIL HFA;VENTOLIN HFA) 108 (90 Base) MCG/ACT inhaler Inhale 2 puffs into the lungs every 6 (six) hours as needed for wheezing or shortness of breath. Patient not taking: Reported on 04/17/2016  09/08/15   Leatha Gilding, MD  albuterol (PROVENTIL HFA;VENTOLIN HFA) 108 (90 Base) MCG/ACT inhaler Inhale 2 puffs into the lungs every 4 (four) hours as needed for wheezing or shortness of breath. 04/17/16   Rui Wordell C Garnet Chatmon, PA-C  benzonatate (TESSALON) 100 MG capsule Take 1 capsule (100 mg total) by mouth every 8 (eight) hours. 04/17/16   Myasia Sinatra C Rillie Riffel, PA-C  cetirizine-pseudoephedrine (ZYRTEC-D) 5-120 MG per tablet Take 1 tablet by mouth daily. Patient not taking: Reported on 09/05/2015 04/29/15   Joycie Peek, PA-C    Family History History reviewed. No pertinent family history.  Social History Social History  Substance Use Topics  . Smoking status: Never Smoker  . Smokeless tobacco: Never Used  . Alcohol use Yes     Allergies   Cinnamon   Review of Systems Review of Systems  Constitutional: Positive for chills. Negative for diaphoresis.  Respiratory: Positive for cough and shortness of breath.   Cardiovascular: Negative for chest pain.  Gastrointestinal: Negative for nausea and vomiting.  All other systems reviewed and are negative.    Physical Exam Updated Vital Signs BP 102/68   Pulse (!) 156 Comment: pt currently taking neb tx. pt states she is feeling "much better". denies chest pain or discomfort related to elevated HR  Temp 98.2 F (36.8 C) (Oral)   Resp 18   LMP 03/17/2016 (Exact Date)   SpO2 100%   Physical Exam  Constitutional: She  appears well-developed and well-nourished. No distress.  HENT:  Head: Normocephalic and atraumatic.  Eyes: Conjunctivae are normal.  Neck: Neck supple.  Cardiovascular: Regular rhythm, normal heart sounds and intact distal pulses.  Tachycardia present.   Pulmonary/Chest: Tachypnea noted. She has decreased breath sounds in the left middle field and the left lower field. She has wheezes in the right upper field, the right middle field, the right lower field and the left upper field.  Increased work of breathing. Speaks in short  phrases.   Abdominal: Soft. There is no tenderness. There is no guarding.  Musculoskeletal: She exhibits no edema or tenderness.  Lymphadenopathy:    She has no cervical adenopathy.  Neurological: She is alert.  Skin: Skin is warm and dry. She is not diaphoretic.  Psychiatric: She has a normal mood and affect. Her behavior is normal.  Nursing note and vitals reviewed.    ED Treatments / Results  Labs (all labs ordered are listed, but only abnormal results are displayed) Labs Reviewed - No data to display  EKG  EKG Interpretation None       Radiology Dg Chest 2 View  Result Date: 04/17/2016 CLINICAL DATA:  Shortness of breath and cough EXAM: CHEST  2 VIEW COMPARISON:  September 05, 2015 FINDINGS: Lungs are clear. Heart size and pulmonary vascularity are normal. No adenopathy. No bone lesions. IMPRESSION: No edema or consolidation. Electronically Signed   By: Bretta BangWilliam  Woodruff III M.D.   On: 04/17/2016 13:13    Procedures Procedures (including critical care time)  Medications Ordered in ED Medications  ipratropium-albuterol (DUONEB) 0.5-2.5 (3) MG/3ML nebulizer solution 3 mL (3 mLs Nebulization Given 04/17/16 1316)  sodium chloride 0.9 % bolus 1,000 mL (0 mLs Intravenous Stopped 04/17/16 1509)  methylPREDNISolone sodium succinate (SOLU-MEDROL) 125 mg/2 mL injection 125 mg (125 mg Intravenous Given 04/17/16 1329)  albuterol (PROVENTIL,VENTOLIN) solution continuous neb (10 mg/hr Nebulization Given 04/17/16 1410)  albuterol (PROVENTIL HFA;VENTOLIN HFA) 108 (90 Base) MCG/ACT inhaler 2 puff (2 puffs Inhalation Given 04/17/16 1521)     Initial Impression / Assessment and Plan / ED Course  I have reviewed the triage vital signs and the nursing notes.  Pertinent labs & imaging results that were available during my care of the patient were reviewed by me and considered in my medical decision making (see chart for details).  Clinical Course    Danielle Carlson presents with congestion,  productive cough, and shortness of breath worsening over the last 2 weeks.  Seasonal allergies or a viral URI causing an asthma exacerbation. Nontoxic appearing and afebrile. Doubt sepsis. Patient improved with DuoNeb and Solu-Medrol. Clear chest x-ray. On reassessment, wheezes are still present in all fields. She is no longer diminished on the left. Following the continuous nebulizer, patient has minimal wheezing, speaks in full sentences and laughs readily. Patient denies any shortness of breath. Patient to follow-up with PCP. The patient was given instructions for home care as well as return precautions. Patient voices understanding of these instructions, accepts the plan, and is comfortable with discharge.  Vitals:   04/17/16 1138 04/17/16 1425 04/17/16 1500 04/17/16 1503  BP: 128/99 102/68    Pulse: 114 110 (!) 146 (!) 156  Resp: 18 18    Temp: 98.2 F (36.8 C)     TempSrc: Oral     SpO2: 95% 100% 100% 100%   The patient's tachycardia is noted. Manually checked the patient's pulse just prior to discharge and found it to be recovering predictably. Any remaining  tachycardia is likely due to the albuterol.  Final Clinical Impressions(s) / ED Diagnoses   Final diagnoses:  Asthma exacerbation  Seasonal allergies    New Prescriptions Discharge Medication List as of 04/17/2016  3:41 PM    START taking these medications   Details  !! albuterol (PROVENTIL HFA;VENTOLIN HFA) 108 (90 Base) MCG/ACT inhaler Inhale 2 puffs into the lungs every 4 (four) hours as needed for wheezing or shortness of breath., Starting Wed 04/17/2016, Print    benzonatate (TESSALON) 100 MG capsule Take 1 capsule (100 mg total) by mouth every 8 (eight) hours., Starting Wed 04/17/2016, Print     !! - Potential duplicate medications found. Please discuss with provider.       Anselm PancoastShawn C Dorsie Burich, PA-C 04/17/16 1602    Anselm PancoastShawn C Izaya Netherton, PA-C 04/17/16 1604    Benjiman CoreNathan Pickering, MD 04/17/16 [phone]

## 2016-04-17 NOTE — Progress Notes (Signed)
Pt noted with ? Medicaid when ED CM and [facility] staff reviewed  Pt confirms having insurance coverage but was also given [employer] contact info and uninsured resources

## 2016-04-17 NOTE — ED Notes (Signed)
Bed: WA23 Expected date:  Expected time:  Means of arrival:  Comments: tr2 

## 2016-04-17 NOTE — ED Triage Notes (Addendum)
Pt c/o sob, cough and congestion r/t allergies. Pt denies cp. Pt states hx of asthma. Pt denies using rescue inhaler at home. Audible wheezing is heard in triage upon pts respirations.

## 2016-04-17 NOTE — ED Notes (Signed)
Patient transported to X-ray 

## 2016-04-17 NOTE — Discharge Instructions (Signed)
You have been seen today for shortness of breath with a cough. Your imaging showed no abnormalities. Follow up with PCP as soon as possible to establish care and to facilitate reassessment. Return to ED should symptoms worsen.  Your symptoms are consistent with a viral illness or an increase in your seasonal allergies. Viruses do not require antibiotics. Treatment is symptomatic care. Drink plenty of fluids and get plenty of rest. Ibuprofen, Naproxen, or Tylenol for pain or fever. Tessalon for cough. Plain Mucinex may help relieve congestion.

## 2019-03-05 ENCOUNTER — Encounter (HOSPITAL_COMMUNITY): Payer: Self-pay

## 2019-03-05 ENCOUNTER — Other Ambulatory Visit: Payer: Self-pay

## 2019-03-05 DIAGNOSIS — H6692 Otitis media, unspecified, left ear: Secondary | ICD-10-CM | POA: Insufficient documentation

## 2019-03-05 DIAGNOSIS — J45909 Unspecified asthma, uncomplicated: Secondary | ICD-10-CM | POA: Insufficient documentation

## 2019-03-05 DIAGNOSIS — J019 Acute sinusitis, unspecified: Secondary | ICD-10-CM | POA: Insufficient documentation

## 2019-03-05 NOTE — ED Triage Notes (Signed)
Pt states about a week ago she had inner ear pain. Pt also noticed some nasal and head congestion. Today she felt slightly short of breath and tried her home breathing machine that did not help. Pt stating some coworkers have tested positive. Pt in no distress at this time.

## 2019-03-06 ENCOUNTER — Emergency Department (HOSPITAL_COMMUNITY)
Admission: EM | Admit: 2019-03-06 | Discharge: 2019-03-06 | Disposition: A | Discharge status: Home / Self Care | Payer: Self-pay | Attending: Emergency Medicine | Admitting: Emergency Medicine

## 2019-03-06 DIAGNOSIS — J019 Acute sinusitis, unspecified: Secondary | ICD-10-CM

## 2019-03-06 DIAGNOSIS — H6690 Otitis media, unspecified, unspecified ear: Secondary | ICD-10-CM

## 2019-03-06 MED ORDER — PREDNISONE 10 MG PO TABS: 50.0000 mg | ORAL_TABLET | Freq: Every day | ORAL | 0 refills | Status: DC

## 2019-03-06 MED ORDER — PREDNISONE 20 MG PO TABS
60.0000 mg | ORAL_TABLET | Freq: Once | ORAL | Status: AC
  Administered 2019-03-06: 60 mg via ORAL
  Filled 2019-03-06: qty 3

## 2019-03-06 MED ORDER — AMOXICILLIN 500 MG PO CAPS: 500.0000 mg | ORAL_CAPSULE | Freq: Three times a day (TID) | ORAL | 0 refills | Status: DC

## 2019-03-06 MED ORDER — ALBUTEROL SULFATE HFA 108 (90 BASE) MCG/ACT IN AERS
6.0000 | INHALATION_SPRAY | Freq: Once | RESPIRATORY_TRACT | Status: AC
  Administered 2019-03-06: 6 via RESPIRATORY_TRACT
  Filled 2019-03-06: qty 6.7

## 2019-03-06 MED ORDER — CETIRIZINE HCL 10 MG PO TABS: 10.0000 mg | ORAL_TABLET | Freq: Every day | ORAL | 0 refills | Status: DC

## 2019-03-06 NOTE — Discharge Instructions (Signed)
Take the medications prescribed for symptom control.  As discussed, perhaps you wait couple of days to see if the symptoms resolve with the medications prescribed, if they do not then you can take the antibiotics.

## 2019-03-06 NOTE — ED Provider Notes (Signed)
[facility]-EMERGENCY DEPT Provider Note   CSN: [id number] Arrival date & time: 03/05/19  2056     History   Chief Complaint Chief Complaint  Patient presents with  . Shortness of Breath    HPI Oline Tamera Reasonan Abed is a 23 y.o. female.     HPI  23 year old female comes in a chief complaint of shortness of breath.  Patient has history of seasonal allergies, asthma.  She reports that over the past 10 days she has been having some URI-like symptoms with congestion, change in voice, rhinorrhea.  Over time she has been having worsening in shortness of breath, that has not gotten better despite taking albuterol.  She denies any wheezing or chest pain.  She is also having inner ear pain that is new now, left worse than right.  Past Medical History:  Diagnosis Date  . Asthma   . Pneumonia   . Seasonal allergies     Patient Active Problem List   Diagnosis Date Noted  . Asthma exacerbation 09/07/2015  . Acute asthma exacerbation 09/06/2015  . Status asthmaticus 08/14/2014  . Nausea vomiting and diarrhea 08/14/2014  . Asthma   . Seasonal allergies   . Acute respiratory failure with hypoxia (HCC) 02/24/2013  . History of asthma 02/24/2013  . Tachycardia 02/24/2013  . CAP (community acquired pneumonia) 02/24/2013  . Asthma with acute exacerbation 02/24/2013    History reviewed. No pertinent surgical history.   OB History   No obstetric history on file.      Home Medications    Prior to Admission medications   Medication Sig Start Date End Date Taking? Authorizing Provider  albuterol (ACCUNEB) 0.63 MG/3ML nebulizer solution Take 1 ampule by nebulization every 6 (six) hours as needed for wheezing or shortness of breath.   Yes [provider]  albuterol (PROVENTIL HFA;VENTOLIN HFA) 108 (90 Base) MCG/ACT inhaler Inhale 2 puffs into the lungs every 6 (six) hours as needed for wheezing or shortness of breath. Patient not taking: Reported on 04/17/2016  09/08/15   Leatha GildingGherghe, Costin M, MD  albuterol (PROVENTIL HFA;VENTOLIN HFA) 108 (90 Base) MCG/ACT inhaler Inhale 2 puffs into the lungs every 4 (four) hours as needed for wheezing or shortness of breath. Patient not taking: Reported on 03/06/2019 04/17/16   Harolyn RutherfordJoy, Shawn C, PA-C  amoxicillin (AMOXIL) 500 MG capsule Take 1 capsule (500 mg total) by mouth 3 (three) times daily. 03/06/19   Derwood KaplanNanavati, Andi Layfield, MD  cetirizine (ZYRTEC) 10 MG tablet Take 1 tablet (10 mg total) by mouth daily. 03/06/19   Derwood KaplanNanavati, Leisel Pinette, MD  cetirizine-pseudoephedrine (ZYRTEC-D) 5-120 MG per tablet Take 1 tablet by mouth daily. Patient not taking: Reported on 09/05/2015 04/29/15   Joycie Peekartner, Benjamin, PA-C  predniSONE (DELTASONE) 10 MG tablet Take 5 tablets (50 mg total) by mouth daily. 03/06/19   Derwood KaplanNanavati, Ilena Dieckman, MD    Family History No family history on file.  Social History Social History   Tobacco Use  . Smoking status: Never Smoker  . Smokeless tobacco: Never Used  Substance Use Topics  . Alcohol use: Yes  . Drug use: Yes    Types: Marijuana     Allergies   Cinnamon and Peanut-containing drug products   Review of Systems Review of Systems  Constitutional: Positive for activity change.  HENT: Positive for congestion and sinus pressure.   Respiratory: Positive for shortness of breath. Negative for wheezing.   Cardiovascular: Negative for chest pain.  Gastrointestinal: Negative for nausea and vomiting.  All other systems  reviewed and are negative.    Physical Exam Updated Vital Signs BP 110/86   Pulse 71   Temp 98.6 F (37 C) (Oral)   Resp 16   Ht 5\' 4"  (1.626 m)   Wt 55.8 kg   SpO2 100%   BMI 21.11 kg/m   Physical Exam Vitals signs and nursing note reviewed.  Constitutional:      Appearance: She is well-developed.  HENT:     Head: Normocephalic and atraumatic.     Comments: Left ear is full and edematous.  No exudate appreciated. Positive preauricular and cervical lymphadenopathy. Neck:      Musculoskeletal: Normal range of motion and neck supple.  Cardiovascular:     Rate and Rhythm: Normal rate.  Pulmonary:     Effort: Pulmonary effort is normal.  Abdominal:     General: Bowel sounds are normal.  Skin:    General: Skin is warm and dry.  Neurological:     Mental Status: She is alert and oriented to person, place, and time.      ED Treatments / Results  Labs (all labs ordered are listed, but only abnormal results are displayed) Labs Reviewed - No data to display  EKG None  Radiology No results found.  Procedures Procedures (including critical care time)  Medications Ordered in ED Medications  albuterol (VENTOLIN HFA) 108 (90 Base) MCG/ACT inhaler 6 puff (6 puffs Inhalation Given 03/06/19 0305)  predniSONE (DELTASONE) tablet 60 mg (60 mg Oral Given 03/06/19 0304)     Initial Impression / Assessment and Plan / ED Course  I have reviewed the triage vital signs and the nursing notes.  Pertinent labs & imaging results that were available during my care of the patient were reviewed by me and considered in my medical decision making (see chart for details).        23 year old comes in a chief complaint of difficulty in breathing.  She has history of asthma and also allergies.  She is having URI-like symptoms for the last several days, now having shortness of breath and ear pain.  Left ear does appear full on exam.  We will give her antihistamines and prednisone.  She will take antibiotics if her symptoms do not resolve.  Final Clinical Impressions(s) / ED Diagnoses   Final diagnoses:  Acute otitis media, unspecified otitis media type  Acute non-recurrent sinusitis, unspecified location    ED Discharge Orders         Ordered    amoxicillin (AMOXIL) 500 MG capsule  3 times daily     03/06/19 0310    cetirizine (ZYRTEC) 10 MG tablet  Daily     03/06/19 0310    predniSONE (DELTASONE) 10 MG tablet  Daily     03/06/19 0310           Varney Biles,  MD 03/06/19 0321

## 2020-05-02 ENCOUNTER — Ambulatory Visit (HOSPITAL_COMMUNITY)
Admission: EM | Admit: 2020-05-02 | Discharge: 2020-05-02 | Disposition: A | Discharge status: Home / Self Care | Payer: Medicaid Other | Attending: Family Medicine | Admitting: Family Medicine

## 2020-05-02 ENCOUNTER — Other Ambulatory Visit: Payer: Self-pay

## 2020-05-02 ENCOUNTER — Emergency Department (HOSPITAL_COMMUNITY)
Admission: EM | Admit: 2020-05-02 | Discharge: 2020-05-02 | Disposition: A | Discharge status: Home / Self Care | Payer: Self-pay | Attending: Emergency Medicine | Admitting: Emergency Medicine

## 2020-05-02 ENCOUNTER — Encounter (HOSPITAL_COMMUNITY): Payer: Self-pay

## 2020-05-02 ENCOUNTER — Emergency Department (HOSPITAL_COMMUNITY): Payer: Self-pay

## 2020-05-02 DIAGNOSIS — M940 Chondrocostal junction syndrome [Tietze]: Secondary | ICD-10-CM

## 2020-05-02 DIAGNOSIS — S76312A Strain of muscle, fascia and tendon of the posterior muscle group at thigh level, left thigh, initial encounter: Secondary | ICD-10-CM

## 2020-05-02 DIAGNOSIS — Z5321 Procedure and treatment not carried out due to patient leaving prior to being seen by health care provider: Secondary | ICD-10-CM | POA: Insufficient documentation

## 2020-05-02 DIAGNOSIS — R079 Chest pain, unspecified: Secondary | ICD-10-CM | POA: Insufficient documentation

## 2020-05-02 DIAGNOSIS — R202 Paresthesia of skin: Secondary | ICD-10-CM | POA: Insufficient documentation

## 2020-05-02 MED ORDER — IBUPROFEN 800 MG PO TABS: 800.0000 mg | ORAL_TABLET | Freq: Three times a day (TID) | ORAL | 0 refills | Status: DC

## 2020-05-02 NOTE — ED Triage Notes (Signed)
Pt presents with complaints of sharp chest pain that has been off and on since March. Reports her pcp told her that she is having chest pain due to her breast size and has been trying more supportive bras without relief. Patient is also having off and on pain in the back of her left thigh.

## 2020-05-02 NOTE — ED Triage Notes (Signed)
C/O sharp left chest pain started back in March and was told to get supportive bras that did help and now started back up in the past month. Patient states the sharp chest pains are random and sometimes when she is working. Also c/o and left leg numbness intermittent since march but worse today.   Chest pain intermittent 7/10 pain  No leg pain at this time.    A/ox4 Ambulatory in triage.   Denies shob.

## 2020-05-03 NOTE — ED Provider Notes (Signed)
[facility]   [id number] 05/02/20 Arrival Time: 1533  ASSESSMENT & PLAN:  1. Chest pain, unspecified type   2. Costochondritis   3. Strain of left hamstring, initial encounter     Patient history and exam consistent with non-cardiac cause of chest pain. Conservative measures indicated.  ECG: Performed today and interpreted by me: normal EKG, normal sinus rhythm.  I have personally viewed the imaging studies ordered this visit. Normal CXR.  Begin: Meds ordered this encounter  Medications  . ibuprofen (ADVIL) 800 MG tablet    Sig: Take 1 tablet (800 mg total) by mouth 3 (three) times daily with meals.    Dispense:  21 tablet    Refill:  0    Chest pain precautions given. Reviewed expectations re: course of current medical issues. Questions answered. Outlined signs and symptoms indicating need for more acute intervention. Patient verbalized understanding. After Visit Summary given.   SUBJECTIVE:  History from: patient. Danielle Carlson is a 24 y.o. female who presents with complaint of upper midline chest pain. On/off; few months; heavy lifting at work and questions relation. Sharp in nature. Worse with certain movements. No associated n/v/SOB. OTC without relief. Also with L hamstring pain; questions strain; no trauma. Ambulatory without difficulty. Illicit drug use: none.  Social History   Tobacco Use  Smoking Status Never Smoker  Smokeless Tobacco Never Used   Social History   Substance and Sexual Activity  Alcohol Use Yes     OBJECTIVE:  Vitals:   05/02/20 1727  BP: 122/76  Pulse: 100  Resp: 18  Temp: 98.2 F (36.8 C)  SpO2: 98%    General appearance: alert, oriented, no acute distress Eyes: PERRLA; EOMI; conjunctivae normal HENT: normocephalic; atraumatic Neck: supple with FROM Lungs: without labored respirations; speaks full sentences without difficulty; CTAB Heart: regular rate and rhythm without murmer Chest Wall: midline R  sternal tenderness to palpation Abdomen: soft, non-tender; no guarding or rebound tenderness Extremities: without edema; without calf swelling or tenderness; symmetrical without gross deformities Skin: warm and dry; without rash or lesions Neuro: normal gait Psychological: alert and cooperative; normal mood and affect  Imaging: DG Chest 2 View  Result Date: 05/02/2020 CLINICAL DATA:  Left-sided chest pain EXAM: CHEST - 2 VIEW COMPARISON:  04/17/2016 FINDINGS: The heart size and mediastinal contours are within normal limits. Both lungs are clear. The visualized skeletal structures are unremarkable. IMPRESSION: No active cardiopulmonary disease. Electronically Signed   By: Alcide Clever M.D.   On: 05/02/2020 12:10     Allergies  Allergen Reactions  . Cinnamon Other (See Comments)    unknown  . Peanut-Containing Drug Products     Itching and facial swelling    Past Medical History:  Diagnosis Date  . Asthma   . Pneumonia   . Seasonal allergies    Social History   Socioeconomic History  . Marital status: Single    Spouse name: Not on file  . Number of children: Not on file  . Years of education: Not on file  . Highest education level: Not on file  Occupational History  . Not on file  Tobacco Use  . Smoking status: Never Smoker  . Smokeless tobacco: Never Used  Substance and Sexual Activity  . Alcohol use: Yes  . Drug use: Yes    Types: Marijuana  . Sexual activity: Not Currently  Other Topics Concern  . Not on file  Social History Narrative  . Not on file   Social Determinants  of Health   Financial Resource Strain:   . Difficulty of Paying Living Expenses: Not on file  Food Insecurity:   . Worried About Programme researcher, broadcasting/film/video in the Last Year: Not on file  . Ran Out of Food in the Last Year: Not on file  Transportation Needs:   . Lack of Transportation (Medical): Not on file  . Lack of Transportation (Non-Medical): Not on file  Physical Activity:   . Days of  Exercise per Week: Not on file  . Minutes of Exercise per Session: Not on file  Stress:   . Feeling of Stress : Not on file  Social Connections:   . Frequency of Communication with Friends and Family: Not on file  . Frequency of Social Gatherings with Friends and Family: Not on file  . Attends Religious Services: Not on file  . Active Member of Clubs or Organizations: Not on file  . Attends Banker Meetings: Not on file  . Marital Status: Not on file  Intimate Partner Violence:   . Fear of Current or Ex-Partner: Not on file  . Emotionally Abused: Not on file  . Physically Abused: Not on file  . Sexually Abused: Not on file   Family History  Problem Relation Age of Onset  . Hypertension Mother   . Healthy Father    History reviewed. No pertinent surgical history.   Mardella Layman, MD 05/03/20 [phone]

## 2020-08-02 ENCOUNTER — Emergency Department (HOSPITAL_COMMUNITY): Payer: Self-pay

## 2020-08-02 ENCOUNTER — Emergency Department (HOSPITAL_COMMUNITY)
Admission: EM | Admit: 2020-08-02 | Discharge: 2020-08-02 | Disposition: A | Discharge status: Home / Self Care | Payer: Self-pay | Attending: Emergency Medicine | Admitting: Emergency Medicine

## 2020-08-02 ENCOUNTER — Other Ambulatory Visit: Payer: Self-pay

## 2020-08-02 DIAGNOSIS — J45901 Unspecified asthma with (acute) exacerbation: Secondary | ICD-10-CM | POA: Insufficient documentation

## 2020-08-02 DIAGNOSIS — Z20822 Contact with and (suspected) exposure to covid-19: Secondary | ICD-10-CM | POA: Insufficient documentation

## 2020-08-02 DIAGNOSIS — R0602 Shortness of breath: Secondary | ICD-10-CM

## 2020-08-02 DIAGNOSIS — Z91010 Allergy to peanuts: Secondary | ICD-10-CM | POA: Insufficient documentation

## 2020-08-02 DIAGNOSIS — R000 Tachycardia, unspecified: Secondary | ICD-10-CM | POA: Insufficient documentation

## 2020-08-02 LAB — RESP PANEL BY RT-PCR (FLU A&B, COVID) ARPGX2
Influenza A by PCR: NEGATIVE
Influenza B by PCR: NEGATIVE
SARS Coronavirus 2 by RT PCR: NEGATIVE

## 2020-08-02 MED ORDER — ALBUTEROL SULFATE (2.5 MG/3ML) 0.083% IN NEBU
5.0000 mg | INHALATION_SOLUTION | Freq: Once | RESPIRATORY_TRACT | Status: AC
  Administered 2020-08-02: 5 mg via RESPIRATORY_TRACT
  Filled 2020-08-02: qty 6

## 2020-08-02 MED ORDER — PREDNISONE 20 MG PO TABS
60.0000 mg | ORAL_TABLET | Freq: Once | ORAL | Status: AC
  Administered 2020-08-02: 60 mg via ORAL
  Filled 2020-08-02: qty 3

## 2020-08-02 MED ORDER — ALBUTEROL SULFATE HFA 108 (90 BASE) MCG/ACT IN AERS
8.0000 | INHALATION_SPRAY | RESPIRATORY_TRACT | Status: DC | PRN
  Administered 2020-08-02: 8 via RESPIRATORY_TRACT
  Filled 2020-08-02: qty 6.7

## 2020-08-02 MED ORDER — IPRATROPIUM BROMIDE 0.02 % IN SOLN
0.5000 mg | Freq: Once | RESPIRATORY_TRACT | Status: AC
  Administered 2020-08-02: 0.5 mg via RESPIRATORY_TRACT
  Filled 2020-08-02: qty 2.5

## 2020-08-02 MED ORDER — PREDNISONE 20 MG PO TABS: 40.0000 mg | ORAL_TABLET | Freq: Every day | ORAL | 0 refills | Status: DC

## 2020-08-02 NOTE — Discharge Instructions (Signed)
Please read and follow all provided instructions.  Your diagnoses today include:  1. Exacerbation of asthma, unspecified asthma severity, unspecified whether persistent   2. Shortness of breath     Tests performed today include:  Chest x-ray - does not show pneumonia  COVID/Flu testing - negative  Vital signs. See below for your results today.   Medications prescribed:   Prednisone - steroid medicine   It is best to take this medication in the morning to prevent sleeping problems. If you are diabetic, monitor your blood sugar closely and stop taking Prednisone if blood sugar is over 300. Take with food to prevent stomach upset.   Take any prescribed medications only as directed.  Home care instructions:  Follow any educational materials contained in this packet.  Follow-up instructions: Please follow-up with your primary care provider in the next 3 days for further evaluation of your symptoms and a recheck if you are not feeling better.   Return instructions:   Please return to the Emergency Department if you experience worsening symptoms.  Please return with worsening wheezing, shortness of breath, or difficulty breathing.  Return with persistent fever above 101F.   Please return if you have any other emergent concerns.  Additional Information:  Your vital signs today were: BP 115/85 (BP Location: Right Arm)    Pulse (!) 125    Temp 98 F (36.7 C) (Oral)    Resp (!) 21    Ht 5\' 4"  (1.626 m)    Wt 55.8 kg    LMP 07/26/2020    SpO2 99%    BMI 21.11 kg/m  If your blood pressure (BP) was elevated above 135/85 this visit, please have this repeated by your doctor within one month. --------------

## 2020-08-02 NOTE — ED Triage Notes (Signed)
Pt POV-  Pt reports ShOB x3-4 days. Pt reports cough, took mucinex, ineffective.  Pt reports worsening cough x3-4 days.  "I have severe allergies and this is what usually happens this time of year."   Pt reports using home nebulizer, no relief.

## 2020-08-02 NOTE — ED Notes (Signed)
Ambulated around room w/ pulse ox, oxygen was 94-100% RA. Patient stated she felt better, less SOB after inhaler use.

## 2020-08-02 NOTE — ED Provider Notes (Signed)
[facility]-EMERGENCY DEPT Provider Note   CSN: [id number] Arrival date & time: 08/02/20  0734     History Chief Complaint  Patient presents with  . Shortness of Breath  . Cough     Danielle Carlson is a 24 y.o. female.  Patient with history of asthma presents emergency department today for shortness of breath and cough occurring over the past 3 days.  Patient denies associated fever, ear pain, runny nose, sore throat.  Cough is productive.  She has been using albuterol nebulizer at home without improvement.  No nausea, vomiting, diarrhea.  No urinary symptoms.  Patient states that she feels short of breath after taking a few steps.  Typically she gets an asthma flare yearly.  She states that the symptoms feel similar.  She has received 1 of 2 Covid vaccines, approximately 1 month ago.  She is due for second vaccine.  No leg swelling or pain.  Patient denies risk factors for pulmonary embolism including: unilateral leg swelling, history of DVT/PE/other blood clots, use of exogenous hormones, recent immobilizations, recent surgery, recent travel (>4hr segment), malignancy, hemoptysis.          Past Medical History:  Diagnosis Date  . Asthma   . Pneumonia   . Seasonal allergies     Patient Active Problem List   Diagnosis Date Noted  . Asthma exacerbation 09/07/2015  . Acute asthma exacerbation 09/06/2015  . Status asthmaticus 08/14/2014  . Nausea vomiting and diarrhea 08/14/2014  . Asthma   . Seasonal allergies   . Acute respiratory failure with hypoxia (HCC) 02/24/2013  . History of asthma 02/24/2013  . Tachycardia 02/24/2013  . CAP (community acquired pneumonia) 02/24/2013  . Asthma with acute exacerbation 02/24/2013    No past surgical history on file.   OB History   No obstetric history on file.     Family History  Problem Relation Age of Onset  . Hypertension Mother   . Healthy Father     Social History   Tobacco Use  . Smoking  status: Never Smoker  . Smokeless tobacco: Never Used  Substance Use Topics  . Alcohol use: Yes  . Drug use: Yes    Types: Marijuana    Home Medications Prior to Admission medications   Medication Sig Start Date End Date Taking? Authorizing Provider  albuterol (ACCUNEB) 0.63 MG/3ML nebulizer solution Take 1 ampule by nebulization every 6 (six) hours as needed for wheezing or shortness of breath.    [provider]  albuterol (PROVENTIL HFA;VENTOLIN HFA) 108 (90 Base) MCG/ACT inhaler Inhale 2 puffs into the lungs every 6 (six) hours as needed for wheezing or shortness of breath. Patient not taking: Reported on 04/17/2016 09/08/15   Leatha Gilding, MD  albuterol (PROVENTIL HFA;VENTOLIN HFA) 108 (90 Base) MCG/ACT inhaler Inhale 2 puffs into the lungs every 4 (four) hours as needed for wheezing or shortness of breath. Patient not taking: Reported on 03/06/2019 04/17/16   Harolyn Rutherford C, PA-C  amoxicillin (AMOXIL) 500 MG capsule Take 1 capsule (500 mg total) by mouth 3 (three) times daily. 03/06/19   Derwood Kaplan, MD  cetirizine (ZYRTEC) 10 MG tablet Take 1 tablet (10 mg total) by mouth daily. 03/06/19   Derwood Kaplan, MD  cetirizine-pseudoephedrine (ZYRTEC-D) 5-120 MG per tablet Take 1 tablet by mouth daily. Patient not taking: Reported on 09/05/2015 04/29/15   Joycie Peek, PA-C  ibuprofen (ADVIL) 800 MG tablet Take 1 tablet (800 mg total) by mouth 3 (three) times  daily with meals. 05/02/20   Mardella Layman, MD  predniSONE (DELTASONE) 10 MG tablet Take 5 tablets (50 mg total) by mouth daily. 03/06/19   Derwood Kaplan, MD    Allergies    Cinnamon and Peanut-containing drug products  Review of Systems   Review of Systems  Constitutional: Negative for fever.  HENT: Negative for rhinorrhea and sore throat.   Eyes: Negative for redness.  Respiratory: Positive for cough, shortness of breath and wheezing.   Cardiovascular: Negative for chest pain and leg swelling.  Gastrointestinal:  Negative for abdominal pain, diarrhea, nausea and vomiting.  Genitourinary: Negative for dysuria, frequency, hematuria and urgency.  Musculoskeletal: Negative for myalgias.  Skin: Negative for rash.  Neurological: Negative for headaches.    Physical Exam Updated Vital Signs BP (!) 113/92   Pulse (!) 113   Temp 98 F (36.7 C) (Oral)   Resp (!) 23   Ht 5\' 4"  (1.626 m)   Wt 55.8 kg   SpO2 100%   BMI 21.11 kg/m   Physical Exam Vitals and nursing note reviewed.  Constitutional:      General: She is not in acute distress.    Appearance: She is well-developed.  HENT:     Head: Normocephalic and atraumatic.     Right Ear: External ear normal.     Left Ear: External ear normal.     Nose: Nose normal.  Eyes:     Conjunctiva/sclera: Conjunctivae normal.  Cardiovascular:     Rate and Rhythm: Regular rhythm. Tachycardia present.     Heart sounds: No murmur heard.   Pulmonary:     Effort: No respiratory distress.     Breath sounds: Wheezing (mild, expiratory) present. No rhonchi or rales.  Abdominal:     Palpations: Abdomen is soft.     Tenderness: There is no abdominal tenderness. There is no guarding or rebound.  Musculoskeletal:     Cervical back: Normal range of motion and neck supple.     Right lower leg: No tenderness. No edema.     Left lower leg: No tenderness. No edema.  Skin:    General: Skin is warm and dry.     Findings: No rash.  Neurological:     General: No focal deficit present.     Mental Status: She is alert. Mental status is at baseline.     Motor: No weakness.  Psychiatric:        Mood and Affect: Mood normal.     ED Results / Procedures / Treatments   Labs (all labs ordered are listed, but only abnormal results are displayed) Labs Reviewed  RESP PANEL BY RT-PCR (FLU A&B, COVID) [id number]    EKG None  Radiology No results found.  Procedures Procedures (including critical care time)  Medications Ordered in ED Medications  albuterol  (VENTOLIN HFA) 108 (90 Base) MCG/ACT inhaler 8 puff (8 puffs Inhalation Given 08/02/20 0832)  predniSONE (DELTASONE) tablet 60 mg (60 mg Oral Given 08/02/20 0831)    ED Course  I have reviewed the triage vital signs and the nursing notes.  Pertinent labs & imaging results that were available during my care of the patient were reviewed by me and considered in my medical decision making (see chart for details).  Patient seen and examined.  Patient looks well.  She does have signs of asthma exacerbation.  Will ensure no pneumonia or Covid.  Treatment: Albuterol, prednisone.  Low concern for PE, discussed with patient.   Vital signs reviewed and  are as follows: BP 129/89   Pulse (!) 121   Temp 98 F (36.7 C) (Oral)   Resp 17   Ht 5\' 4"  (1.626 m)   Wt 55.8 kg   LMP 07/26/2020   SpO2 99%   BMI 21.11 kg/m   9:50 AM COVID neg. CXR neg.   Patient was able to ambulate in the room without hypoxia. She is feeling improved after albuterol. She does report racing heart, worsened with albuterol.  I asked the patient if she would like to have a nebulizer treatment here, she states yes because this typically helps. She is agreeable to discharge home with steroids. Lung sounds improved, minimal expiratory wheezing. She continues to look very well.   10:46 AM Pt states she feels a lot better now. She is comfortable with d/c to home. States she has contacted her primary care doctor home nebulizer and medications.  I will prescribe prednisone.  HR=130's after neb.  No respiratory distress.  Patient urged to return with worsening symptoms or other concerns. Patient verbalized understanding and agrees with plan.     MDM Rules/Calculators/A&P                          Patient presents to the emergency department today with wheezing, cough and shortness of breath.  No fevers.  She has been tachycardic but has been using reading medications and albuterol and I suspect that this is because of tachycardia.   She does not features concerning for DVT or PE.  No risk factors, chest pain or hypoxia.  She has ambulated well.  Feels much better with treatment in the ED.  Plan Home with continued albuterol and prednisone burst.  Chest x-ray does not demonstrate pneumonia.  Covid and flu testing negative.   Final Clinical Impression(s) / ED Diagnoses Final diagnoses:  Exacerbation of asthma, unspecified asthma severity, unspecified whether persistent  Shortness of breath    Rx / DC Orders ED Discharge Orders         Ordered    predniSONE (DELTASONE) 20 MG tablet  Daily        08/02/20 1049           14/08/21, PA-C 08/02/20 1050    14/08/21, MD 08/04/20 1025

## 2021-03-06 IMAGING — CR DG CHEST 2V
2 series · 2 of 2 positions shown · non-contrast
Comparison: 04/17/2016

CLINICAL DATA: Left-sided chest pain

EXAM:
CHEST - 2 VIEW

[w chest pa]
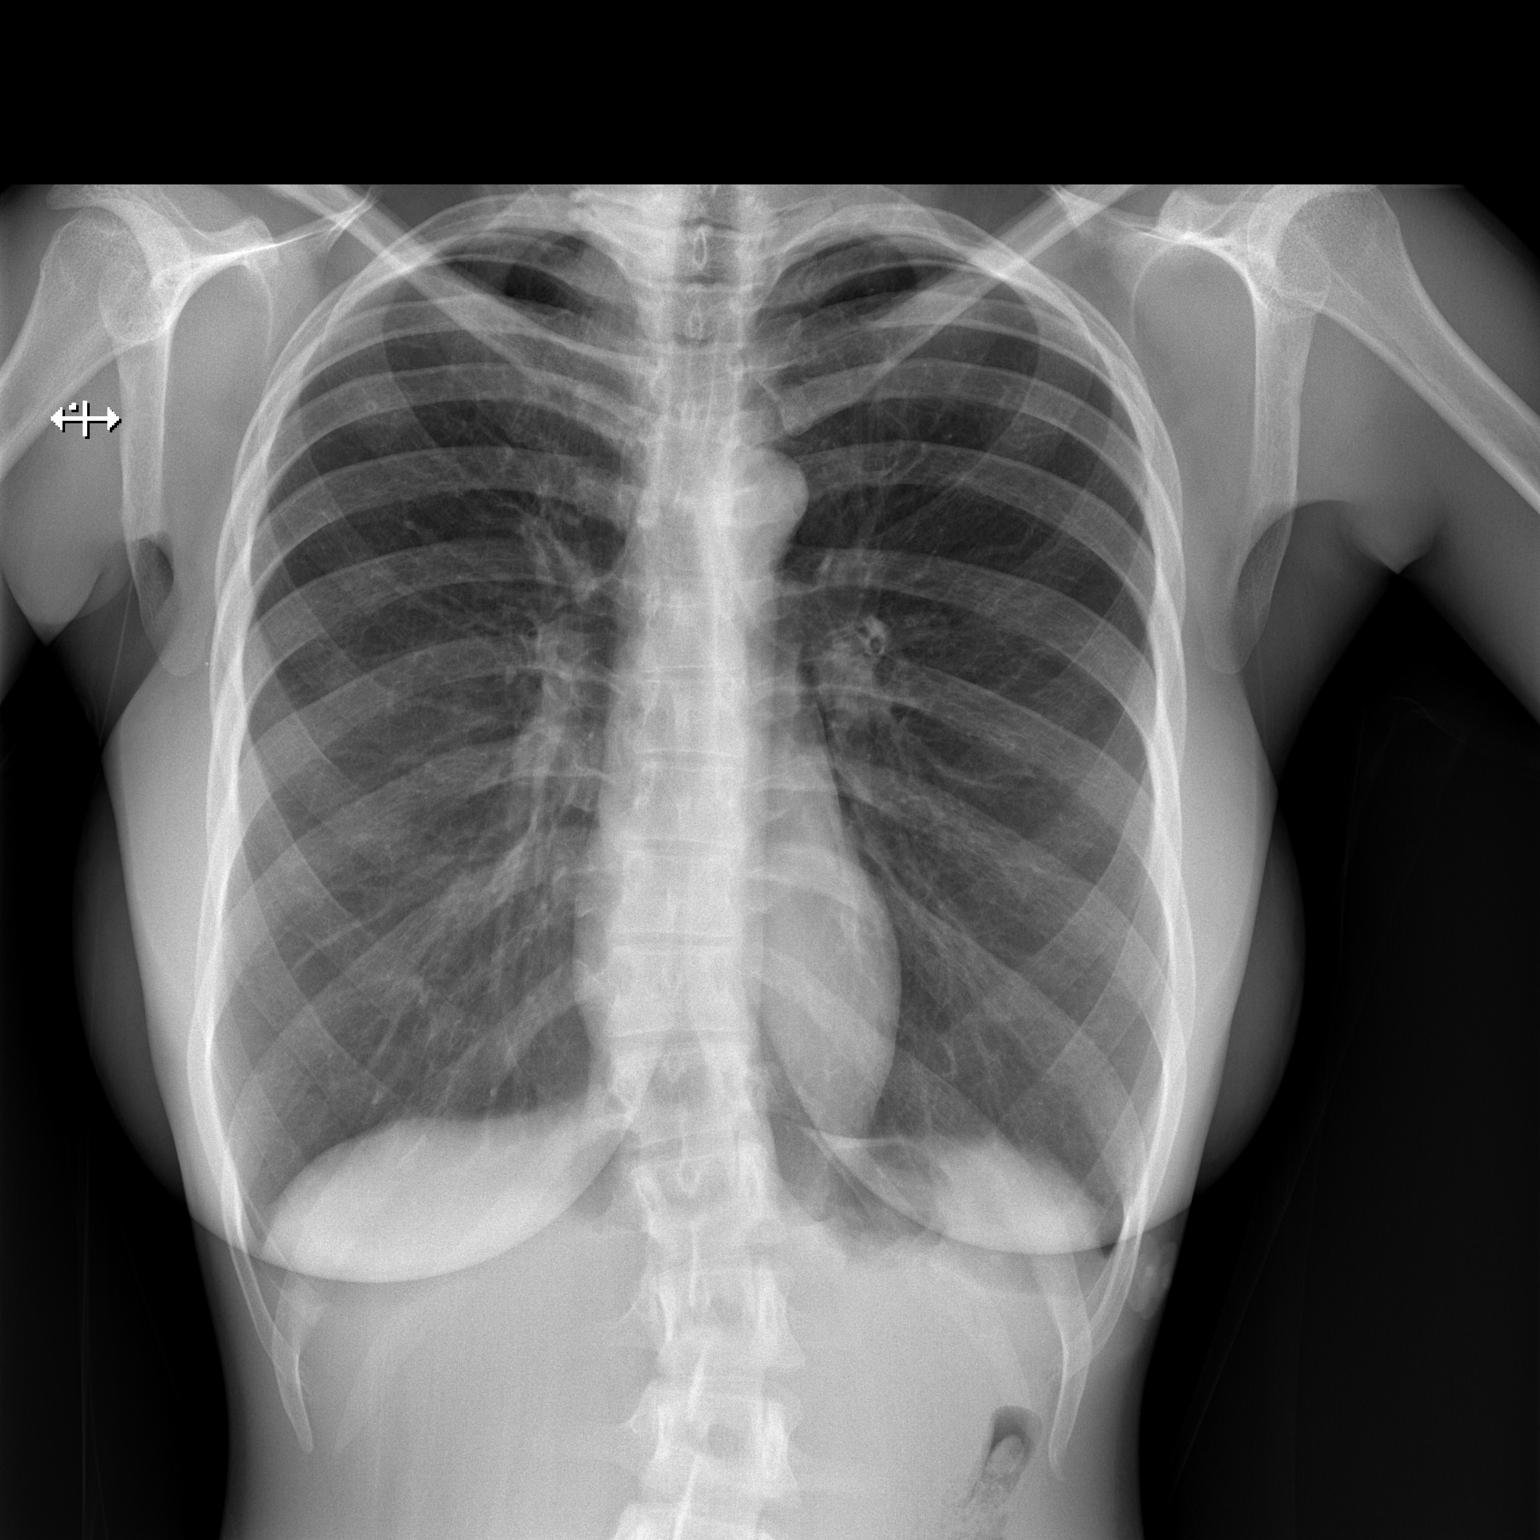

[w chest lat]
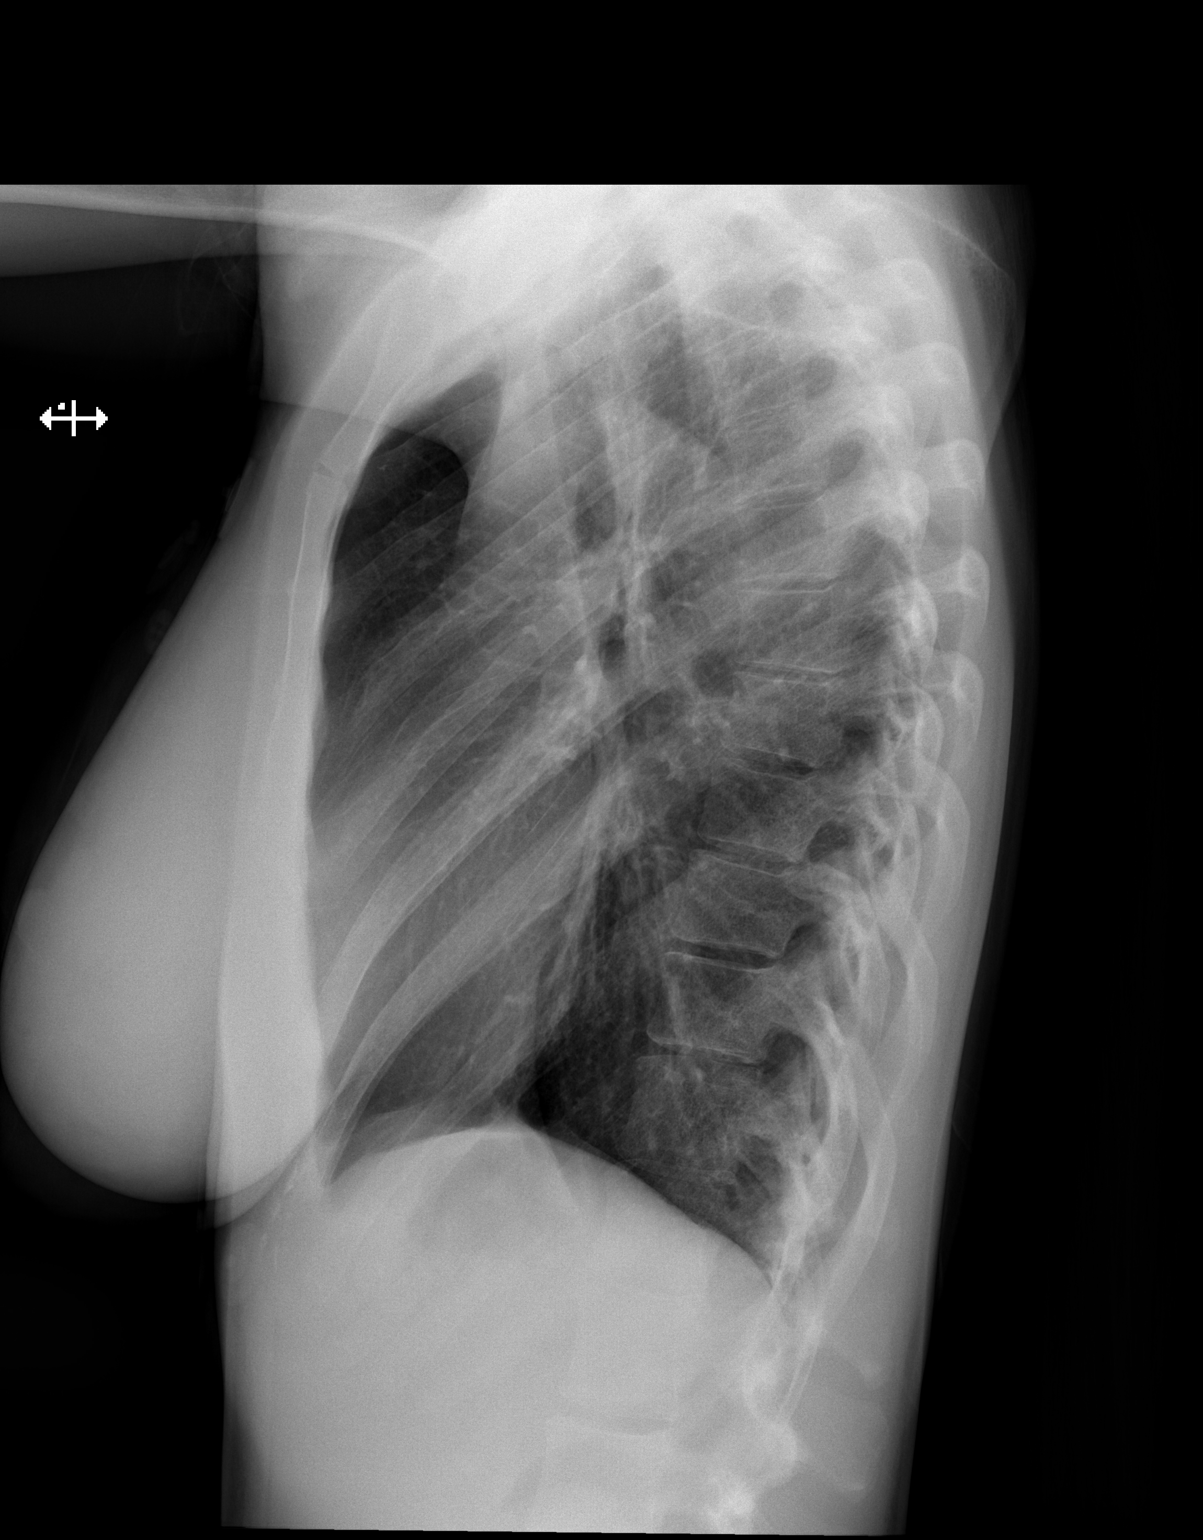

[2 of 2 positions shown; findings below may reference images not displayed]

FINDINGS: The heart size and mediastinal contours are within normal limits.
Both lungs are clear. The visualized skeletal structures are
unremarkable.
IMPRESSION: No active cardiopulmonary disease.

## 2021-08-13 ENCOUNTER — Encounter (HOSPITAL_COMMUNITY): Payer: Self-pay | Admitting: Emergency Medicine

## 2021-08-13 ENCOUNTER — Ambulatory Visit (HOSPITAL_COMMUNITY)
Admission: EM | Admit: 2021-08-13 | Discharge: 2021-08-13 | Disposition: A | Discharge status: Home / Self Care | Payer: Medicaid Other | Attending: Internal Medicine | Admitting: Internal Medicine

## 2021-08-13 ENCOUNTER — Other Ambulatory Visit: Payer: Self-pay

## 2021-08-13 DIAGNOSIS — R0789 Other chest pain: Secondary | ICD-10-CM

## 2021-08-13 NOTE — ED Triage Notes (Signed)
Pt presents with chest pain and burning sensation in chest Xs 2 weeks.

## 2021-08-13 NOTE — ED Provider Notes (Signed)
[facility]    CSN: [id number] Arrival date & time: 08/13/21  1733      History   Chief Complaint Chief Complaint  Patient presents with   Chest Pain   Gastroesophageal Reflux    HPI Danielle Carlson is a 25 y.o. female comes to the urgent care with a 2-week history of recurrent left-sided chest pain associated with a burning sensation in her chest.  Patient says symptoms have been recurrent over the past 2 weeks.  No known aggravating factors.  No known relieving factors.  Is not associated with food.  During these episodes patient is able to control the severity of the sensation by controlling her breathing.  She denies any anxiety episodes.  No cough or sputum production.  He has been evaluated for chest pain in the past.  Patient denies rash on her chest.  No history of shingles.   HPI  Past Medical History:  Diagnosis Date   Asthma    Pneumonia    Seasonal allergies     Patient Active Problem List   Diagnosis Date Noted   Asthma exacerbation 09/07/2015   Acute asthma exacerbation 09/06/2015   Status asthmaticus 08/14/2014   Nausea vomiting and diarrhea 08/14/2014   Asthma    Seasonal allergies    Acute respiratory failure with hypoxia (HCC) 02/24/2013   History of asthma 02/24/2013   Tachycardia 02/24/2013   CAP (community acquired pneumonia) 02/24/2013   Asthma with acute exacerbation 02/24/2013    History reviewed. No pertinent surgical history.  OB History   No obstetric history on file.      Home Medications    Prior to Admission medications   Medication Sig Start Date End Date Taking? Authorizing Provider  montelukast (SINGULAIR) 10 MG tablet Take by mouth. 08/02/20  Yes [provider]  albuterol (ACCUNEB) 0.63 MG/3ML nebulizer solution Take 1 ampule by nebulization every 6 (six) hours as needed for wheezing or shortness of breath.    [provider]  cetirizine (ZYRTEC) 10 MG tablet Take 1 tablet (10 mg total) by mouth  daily. 03/06/19 08/02/20  Derwood Kaplan, MD    Family History Family History  Problem Relation Age of Onset   Hypertension Mother    Healthy Father     Social History Social History   Tobacco Use   Smoking status: Never   Smokeless tobacco: Never  Substance Use Topics   Alcohol use: Yes   Drug use: Yes    Types: Marijuana     Allergies   Cinnamon and Peanut-containing drug products   Review of Systems Review of Systems As per HPI  Physical Exam Triage Vital Signs ED Triage Vitals  Enc Vitals Group     BP 08/13/21 1809 112/83     Pulse Rate 08/13/21 1809 81     Resp 08/13/21 1809 17     Temp 08/13/21 1809 98.1 F (36.7 C)     Temp Source 08/13/21 1809 Oral     SpO2 08/13/21 1809 99 %     Weight --      Height --      Head Circumference --      Peak Flow --      Pain Score 08/13/21 1807 0     Pain Loc --      Pain Edu? --      Excl. in GC? --    No data found.  Updated Vital Signs BP 112/83 (BP Location: Right Arm)  Pulse 81    Temp 98.1 F (36.7 C) (Oral)    Resp 17    LMP 08/13/2021    SpO2 99%   Visual Acuity Right Eye Distance:   Left Eye Distance:   Bilateral Distance:    Right Eye Near:   Left Eye Near:    Bilateral Near:     Physical Exam Vitals and nursing note reviewed.  Constitutional:      General: She is not in acute distress.    Appearance: She is not ill-appearing.  Cardiovascular:     Rate and Rhythm: Normal rate and regular rhythm.     Heart sounds: Normal heart sounds.  Pulmonary:     Effort: Pulmonary effort is normal.     Breath sounds: Normal breath sounds.  Abdominal:     General: Bowel sounds are normal.  Musculoskeletal:     Cervical back: Normal range of motion.  Neurological:     Mental Status: She is alert.     UC Treatments / Results  Labs (all labs ordered are listed, but only abnormal results are displayed) Labs Reviewed - No data to display  EKG   Radiology No results  found.  Procedures Procedures (including critical care time)  Medications Ordered in UC Medications - No data to display  Initial Impression / Assessment and Plan / UC Course  I have reviewed the triage vital signs and the nursing notes.  Pertinent labs & imaging results that were available during my care of the patient were reviewed by me and considered in my medical decision making (see chart for details).     1.  Chest wall pain: EKG shows normal sinus rhythm This is likely chest wall pain Patient is advised to follow-up with primary care physician if symptoms persist Trial of Tums or Pepcid as recommended as this may be gastroesophageal reflux disease. Final Clinical Impressions(s) / UC Diagnoses   Final diagnoses:  Chest wall pain     Discharge Instructions      Your EKG is not indicated for of heart disease or heart attack Monitor the frequency of your symptoms If symptoms worsen please return to urgent care to be reevaluated.    ED Prescriptions   None    PDMP not reviewed this encounter.   Merrilee Jansky, MD 08/13/21 [phone]

## 2021-08-13 NOTE — Discharge Instructions (Addendum)
Your EKG is not indicated for of heart disease or heart attack Monitor the frequency of your symptoms If symptoms worsen please return to urgent care to be reevaluated.

## 2022-02-17 ENCOUNTER — Emergency Department (HOSPITAL_COMMUNITY): Payer: Self-pay

## 2022-02-17 ENCOUNTER — Emergency Department (HOSPITAL_COMMUNITY)
Admission: EM | Admit: 2022-02-17 | Discharge: 2022-02-17 | Disposition: A | Discharge status: Home / Self Care | Payer: Self-pay | Attending: Emergency Medicine | Admitting: Emergency Medicine

## 2022-02-17 ENCOUNTER — Other Ambulatory Visit: Payer: Self-pay

## 2022-02-17 ENCOUNTER — Encounter (HOSPITAL_COMMUNITY): Payer: Self-pay | Admitting: Emergency Medicine

## 2022-02-17 DIAGNOSIS — R197 Diarrhea, unspecified: Secondary | ICD-10-CM | POA: Insufficient documentation

## 2022-02-17 DIAGNOSIS — R519 Headache, unspecified: Secondary | ICD-10-CM

## 2022-02-17 DIAGNOSIS — J321 Chronic frontal sinusitis: Secondary | ICD-10-CM | POA: Insufficient documentation

## 2022-02-17 DIAGNOSIS — R002 Palpitations: Secondary | ICD-10-CM | POA: Insufficient documentation

## 2022-02-17 DIAGNOSIS — J45909 Unspecified asthma, uncomplicated: Secondary | ICD-10-CM | POA: Insufficient documentation

## 2022-02-17 DIAGNOSIS — R0789 Other chest pain: Secondary | ICD-10-CM | POA: Insufficient documentation

## 2022-02-17 LAB — CBC
HCT: 41.9 % (ref 36.0–46.0)
Hemoglobin: 13.5 g/dL (ref 12.0–15.0)
MCH: 25.3 pg — ABNORMAL LOW (ref 26.0–34.0)
MCHC: 32.2 g/dL (ref 30.0–36.0)
MCV: 78.5 fL — ABNORMAL LOW (ref 80.0–100.0)
Platelets: 313 10*3/uL (ref 150–400)
RBC: 5.34 MIL/uL — ABNORMAL HIGH (ref 3.87–5.11)
RDW: 14 % (ref 11.5–15.5)
WBC: 4.2 10*3/uL (ref 4.0–10.5)
nRBC: 0 % (ref 0.0–0.2)

## 2022-02-17 LAB — BASIC METABOLIC PANEL
Anion gap: 7 (ref 5–15)
BUN: 9 mg/dL (ref 6–20)
CO2: 24 mmol/L (ref 22–32)
Calcium: 9.8 mg/dL (ref 8.9–10.3)
Chloride: 107 mmol/L (ref 98–111)
Creatinine, Ser: 0.67 mg/dL (ref 0.44–1.00)
GFR, Estimated: >60 mL/min (ref >60)
Glucose, Bld: 107 mg/dL — ABNORMAL HIGH (ref 70–99)
Potassium: 4.1 mmol/L (ref 3.5–5.1)
Sodium: 138 mmol/L (ref 135–145)

## 2022-02-17 LAB — I-STAT BETA HCG BLOOD, ED (MC, WL, AP ONLY): I-stat hCG, quantitative: <5 m[IU]/mL (ref <5)

## 2022-02-17 LAB — TROPONIN I (HIGH SENSITIVITY): Troponin I (High Sensitivity): <2 ng/L (ref <18)

## 2022-02-17 MED ORDER — SALINE SPRAY 0.65 % NA SOLN: 1.0000 | NASAL | 0 refills | Status: AC | PRN

## 2022-02-17 MED ORDER — METOCLOPRAMIDE HCL 5 MG/ML IJ SOLN
5.0000 mg | Freq: Once | INTRAMUSCULAR | Status: AC
  Administered 2022-02-17: 5 mg via INTRAVENOUS
  Filled 2022-02-17: qty 2

## 2022-02-17 MED ORDER — DIPHENHYDRAMINE HCL 50 MG/ML IJ SOLN
12.5000 mg | Freq: Once | INTRAMUSCULAR | Status: AC
  Administered 2022-02-17: 12.5 mg via INTRAVENOUS
  Filled 2022-02-17: qty 1

## 2022-02-17 MED ORDER — FEXOFENADINE HCL 60 MG PO TABS: 60.0000 mg | ORAL_TABLET | Freq: Two times a day (BID) | ORAL | 0 refills | Status: AC

## 2022-02-17 MED ORDER — ACETAMINOPHEN 325 MG PO TABS
650.0000 mg | ORAL_TABLET | Freq: Once | ORAL | Status: AC
  Administered 2022-02-17: 650 mg via ORAL
  Filled 2022-02-17: qty 2

## 2022-02-17 MED ORDER — SODIUM CHLORIDE 0.9 % IV BOLUS
1000.0000 mL | Freq: Once | INTRAVENOUS | Status: AC
  Administered 2022-02-17: 1000 mL via INTRAVENOUS

## 2022-02-17 NOTE — ED Provider Notes (Signed)
[facility]ENCY [facility]PT Provider Note   CSN: [id number] Arrival date & time: 02/17/22  1202     History  Chief Complaint  Patient presents with   Dizziness   Chest Pain    Danielle Carlson is a 26 y.o. female.  Patient as above with significant medical history as below, including asthma, seasonal allergies, recurrent headaches who presents to the ED with complaint of headache, chest tightness, palpitations, lightheadedness, diarrhea.  Patient reports symptom onset this morning after waking up she began to notice palpitations, heart fluttering.  Chest tightness, she felt lightheaded was having a headache and also had 2 episodes of loose stools.  No nausea or vomiting.  No fevers or chills.  No dyspnea.  Chest pain has resolved.  Headache is similar to prior sinus headaches.  She does have seasonal allergies and feels they have been worse the past week or so.  No recent travel or sick contacts.  No change urination.  No abnormal vaginal bleeding or discharge.  No significant abdominal pain, no recent diet changes, no vomiting.  No rashes.  No neck pain, no head injuries.  No numbness or weakness.  Patient does report that when she noticed the chest pain this morning she been to feel very anxious and that is when the palpitations began to occur.   Past Medical History:  Diagnosis Date   Asthma    Pneumonia    Seasonal allergies     History reviewed. No pertinent surgical history.   The history is provided by the patient. No language interpreter was used.  Dizziness Associated symptoms: chest pain and palpitations   Associated symptoms: no headaches, no nausea, no shortness of breath and no vomiting   Chest Pain Associated symptoms: dizziness, fatigue and palpitations   Associated symptoms: no abdominal pain, no cough, no dysphagia, no fever, no headache, no nausea, no shortness of breath and no vomiting        Home Medications Prior to Admission  medications   Medication Sig Start Date End Date Taking? Authorizing Provider  albuterol (ACCUNEB) 0.63 MG/3ML nebulizer solution Take 1 ampule by nebulization every 6 (six) hours as needed for wheezing or shortness of breath.   Yes [provider]  diphenhydrAMINE (BENADRYL) 25 MG tablet Take 25 mg by mouth every 8 (eight) hours as needed for allergies.   Yes [provider]  fexofenadine (ALLEGRA) 60 MG tablet Take 1 tablet (60 mg total) by mouth 2 (two) times daily. 02/17/22 03/19/22 Yes Tanda Rockers A, DO  montelukast (SINGULAIR) 10 MG tablet Take 10 mg by mouth at bedtime. 08/02/20  Yes [provider]  sodium chloride (OCEAN) 0.65 % SOLN nasal spray Place 1 spray into both nostrils as needed for congestion. 02/17/22  Yes Sloan Leiter, DO  cetirizine (ZYRTEC) 10 MG tablet Take 1 tablet (10 mg total) by mouth daily. 03/06/19 08/02/20  Derwood Kaplan, MD      Allergies    Cinnamon and Peanut-containing drug products    Review of Systems   Review of Systems  Constitutional:  Positive for fatigue. Negative for chills and fever.  HENT:  Negative for facial swelling and trouble swallowing.   Eyes:  Negative for photophobia and visual disturbance.  Respiratory:  Negative for cough and shortness of breath.   Cardiovascular:  Positive for chest pain and palpitations.  Gastrointestinal:  Negative for abdominal pain, nausea and vomiting.  Endocrine: Negative for polydipsia and polyuria.  Genitourinary:  Negative for difficulty urinating  and hematuria.  Musculoskeletal:  Negative for gait problem and joint swelling.  Skin:  Negative for pallor and rash.  Neurological:  Positive for dizziness and light-headedness. Negative for syncope and headaches.  Psychiatric/Behavioral:  Negative for agitation and confusion. The patient is nervous/anxious.     Physical Exam Updated Vital Signs BP 104/81   Pulse 68   Temp (!) 97.5 F (36.4 C) (Oral)   Resp 16   Ht 5\' 4"  (1.626  m)   Wt 52.2 kg   SpO2 98%   BMI 19.74 kg/m  Physical Exam Vitals and nursing note reviewed.  Constitutional:      General: She is not in acute distress.    Appearance: She is well-developed. She is not ill-appearing or diaphoretic.  HENT:     Head: Normocephalic and atraumatic.  Eyes:     Extraocular Movements: Extraocular movements intact.     Conjunctiva/sclera: Conjunctivae normal.     Pupils: Pupils are equal, round, and reactive to light.  Cardiovascular:     Rate and Rhythm: Normal rate and regular rhythm.     Heart sounds: No murmur heard.    No systolic murmur is present.     No S3 or S4 sounds.  Pulmonary:     Effort: Pulmonary effort is normal. No respiratory distress.     Breath sounds: Normal breath sounds. No stridor.  Abdominal:     Palpations: Abdomen is soft.     Tenderness: There is no abdominal tenderness.  Musculoskeletal:        General: No swelling.     Cervical back: Full passive range of motion without pain and neck supple.  Skin:    General: Skin is warm and dry.     Capillary Refill: Capillary refill takes less than 2 seconds.  Neurological:     Mental Status: She is alert and oriented to person, place, and time.     GCS: GCS eye subscore is 4. GCS verbal subscore is 5. GCS motor subscore is 6.     Cranial Nerves: Cranial nerves 2-12 are intact. No dysarthria or facial asymmetry.     Sensory: Sensation is intact. No sensory deficit.     Motor: Motor function is intact. No tremor.     Coordination: Coordination is intact. Finger-Nose-Finger Test normal.     Gait: Gait is intact.     Comments: Strength 5/5 bilateral upper extremities Strength 5/5 bilateral lower extremities  Psychiatric:        Mood and Affect: Mood normal.     ED Results / Procedures / Treatments   Labs (all labs ordered are listed, but only abnormal results are displayed) Labs Reviewed  BASIC METABOLIC PANEL - Abnormal; Notable for the following components:      Result  Value   Glucose, Bld 107 (*)    All other components within normal limits  CBC - Abnormal; Notable for the following components:   RBC 5.34 (*)    MCV 78.5 (*)    MCH 25.3 (*)    All other components within normal limits  TSH  I-STAT BETA HCG BLOOD, ED ([facility], [facility], [facility] ONLY)  TROPONIN I (HIGH SENSITIVITY)    EKG EKG Interpretation  Date/Time:  Sunday February 17 2022 12:19:20 EDT Ventricular Rate:  83 PR Interval:  136 QRS Duration: 74 QT Interval:  337 QTC Calculation: 396 R Axis:   83 Text Interpretation: Sinus rhythm Nonspecific T abnormalities, lateral leads Interpretation limited secondary to artifact similar to prior no  stemi Confirmed by Tanda Rockers ([id number]) on 02/17/2022 2:53:38 PM  Radiology DG Chest 2 View  Result Date: 02/17/2022 CLINICAL DATA:  Chest pain and tachycardia.  Dizziness. EXAM: CHEST - 2 VIEW COMPARISON:  08/02/2020 FINDINGS: The cardiac silhouette, mediastinal and hilar contours are normal. The lungs are clear. Mild hyperinflation. No pleural effusions. No pulmonary lesions. The bony thorax is normal. IMPRESSION: Hyperinflation but no infiltrates or effusions. Electronically Signed   By: Rudie Meyer M.D.   On: 02/17/2022 12:59    Procedures Procedures    Medications Ordered in ED Medications  sodium chloride 0.9 % bolus 1,000 mL (1,000 mLs Intravenous New Bag/Given 02/17/22 1456)  metoCLOPramide (REGLAN) injection 5 mg (5 mg Intravenous Given 02/17/22 1456)  diphenhydrAMINE (BENADRYL) injection 12.5 mg (12.5 mg Intravenous Given 02/17/22 1456)  acetaminophen (TYLENOL) tablet 650 mg (650 mg Oral Given 02/17/22 1459)    ED Course/ Medical Decision Making/ A&P                           Medical Decision Making Amount and/or Complexity of Data Reviewed Labs: ordered. Radiology: ordered.  Risk OTC drugs. Prescription drug management.    CC: cp, ha, light headed  This patient presents to the Emergency Department for the above complaint. This involves an  extensive number of treatment options and is a complaint that carries with it a high risk of complications and morbidity. Vital signs were reviewed. Serious etiologies considered.  Differential includes all life-threatening causes for chest pain. This includes but is not exclusive to acute coronary syndrome, aortic dissection, pulmonary embolism, cardiac tamponade, community-acquired pneumonia, pericarditis, musculoskeletal chest wall pain, etc.  Differential diagnosis includes but is not exclusive to subarachnoid hemorrhage, meningitis, encephalitis, previous head trauma, cavernous venous thrombosis, muscle tension headache, glaucoma, temporal arteritis, migraine or migraine equivalent, etc.  Patient is neurologically intact entirely.  No focal deficits.  Record review:  Previous records obtained and reviewed  Prior ED visit, prior office visit, prior labs and imaging  Additional history obtained from N/A  Medical and surgical history as noted above.   Work up as above, notable for:  Labs & imaging results that were available during my care of the patient were visualized by me and considered in my medical decision making.   I ordered imaging studies which included chest x-ray. I visualized the imaging, interpreted images, and I agree with radiologist interpretation.  No infiltrate or consolidation  Cardiac monitoring reviewed and interpreted personally which shows NSR  Labs reviewed and are stable.  Management: Headache cocktail, ivf   ED Course:     Reassessment:  Symptoms have resolved  Admission was considered.   Symptoms have resolved.  Favor component of mild dehydration, sinus headache and anxiety as etiology of her complaints today.  Low suspicion for ACS.  Neurologically intact.  Low suspicion for acute intracranial process.  Advised patient to take oral antihistamine and saline spray/rinse for nose, humidifier in bedroom.  Follow-up PCP.     The patient's chest  pain is not suggestive of pulmonary embolus, cardiac ischemia, aortic dissection, pericarditis, myocarditis, pulmonary embolism, pneumothorax, pneumonia, Zoster, or esophageal perforation, or other serious etiology.  Historically not abrupt in onset, tearing or ripping, pulses symmetric. EKG nonspecific for ischemia/infarction. No dysrhythmias, brugada, WPW, prolonged QT noted.    Troponin negative x1, high-sensitivity troponin less than 2. CXR reviewed. Labs without demonstration of acute pathology unless otherwise noted above. Low HEAR Score  Given the extremely low  risk of these diagnoses further testing and evaluation for these possibilities does not appear to be indicated at this time. Patient in no distress and overall condition improved here in the ED. Detailed discussions were had with the patient regarding current findings, and need for close f/u with PCP or on call doctor. The patient has been instructed to return immediately if the symptoms worsen in any way for re-evaluation. Patient verbalized understanding and is in agreement with current care plan. All questions answered prior to discharge.         Social determinants of health include -  Social History   Socioeconomic History   Marital status: Single    Spouse name: Not on file   Number of children: Not on file   Years of education: Not on file   Highest education level: Not on file  Occupational History   Not on file  Tobacco Use   Smoking status: Never   Smokeless tobacco: Never  Substance and Sexual Activity   Alcohol use: Yes   Drug use: Yes    Types: Marijuana   Sexual activity: Not Currently  Other Topics Concern   Not on file  Social History Narrative   Not on file   Social Determinants of Health   Financial Resource Strain: Not on file  Food Insecurity: Not on file  Transportation Needs: Not on file  Physical Activity: Not on file  Stress: Not on file  Social Connections: Not on file  Intimate  Partner Violence: Not on file      This chart was dictated using voice recognition software.  Despite best efforts to proofread,  errors can occur which can change the documentation meaning.         Final Clinical Impression(s) / ED Diagnoses Final diagnoses:  Atypical chest pain  Chronic frontal sinusitis  Sinus headache    Rx / DC Orders ED Discharge Orders          Ordered    sodium chloride (OCEAN) 0.65 % SOLN nasal spray  As needed        02/17/22 1727    fexofenadine (ALLEGRA) 60 MG tablet  2 times daily        02/17/22 1727              Sloan Leiter, DO 02/17/22 1728

## 2022-02-17 NOTE — ED Triage Notes (Signed)
Patient reports waking up this morning and started feeling dizzy, light headed, heart racing and sharp pains in her chest. Chest pain is intermittent and to left upper chest.

## 2023-07-02 ENCOUNTER — Emergency Department (HOSPITAL_COMMUNITY)
Admission: EM | Admit: 2023-07-02 | Discharge: 2023-07-02 | Disposition: A | Discharge status: Home / Self Care | Payer: No Typology Code available for payment source | Attending: Student | Admitting: Student

## 2023-07-02 ENCOUNTER — Other Ambulatory Visit: Payer: Self-pay

## 2023-07-02 ENCOUNTER — Emergency Department (HOSPITAL_COMMUNITY): Payer: No Typology Code available for payment source

## 2023-07-02 DIAGNOSIS — Z91010 Allergy to peanuts: Secondary | ICD-10-CM | POA: Insufficient documentation

## 2023-07-02 DIAGNOSIS — J45909 Unspecified asthma, uncomplicated: Secondary | ICD-10-CM | POA: Diagnosis not present

## 2023-07-02 DIAGNOSIS — S0990XA Unspecified injury of head, initial encounter: Secondary | ICD-10-CM | POA: Insufficient documentation

## 2023-07-02 DIAGNOSIS — M545 Low back pain, unspecified: Secondary | ICD-10-CM | POA: Diagnosis not present

## 2023-07-02 DIAGNOSIS — Y92410 Unspecified street and highway as the place of occurrence of the external cause: Secondary | ICD-10-CM | POA: Insufficient documentation

## 2023-07-02 DIAGNOSIS — M542 Cervicalgia: Secondary | ICD-10-CM | POA: Diagnosis not present

## 2023-07-02 LAB — CBC WITH DIFFERENTIAL/PLATELET
Abs Immature Granulocytes: 0.02 10*3/uL (ref 0.00–0.07)
Basophils Absolute: 0 10*3/uL (ref 0.0–0.1)
Basophils Relative: 0 %
Eosinophils Absolute: 0.8 10*3/uL — ABNORMAL HIGH (ref 0.0–0.5)
Eosinophils Relative: 9 %
HCT: 38.9 % (ref 36.0–46.0)
Hemoglobin: 12.2 g/dL (ref 12.0–15.0)
Immature Granulocytes: 0 %
Lymphocytes Relative: 21 %
Lymphs Abs: 1.8 10*3/uL (ref 0.7–4.0)
MCH: 24.1 pg — ABNORMAL LOW (ref 26.0–34.0)
MCHC: 31.4 g/dL (ref 30.0–36.0)
MCV: 76.7 fL — ABNORMAL LOW (ref 80.0–100.0)
Monocytes Absolute: 0.6 10*3/uL (ref 0.1–1.0)
Monocytes Relative: 7 %
Neutro Abs: 5.3 10*3/uL (ref 1.7–7.7)
Neutrophils Relative %: 63 %
Platelets: 316 10*3/uL (ref 150–400)
RBC: 5.07 MIL/uL (ref 3.87–5.11)
RDW: 13.8 % (ref 11.5–15.5)
WBC: 8.5 10*3/uL (ref 4.0–10.5)
nRBC: 0 % (ref 0.0–0.2)

## 2023-07-02 LAB — COMPREHENSIVE METABOLIC PANEL
ALT: 11 U/L (ref 0–44)
AST: 14 U/L — ABNORMAL LOW (ref 15–41)
Albumin: 3.5 g/dL (ref 3.5–5.0)
Alkaline Phosphatase: 41 U/L (ref 38–126)
Anion gap: 11 (ref 5–15)
BUN: 6 mg/dL (ref 6–20)
CO2: 21 mmol/L — ABNORMAL LOW (ref 22–32)
Calcium: 9.3 mg/dL (ref 8.9–10.3)
Chloride: 105 mmol/L (ref 98–111)
Creatinine, Ser: 0.78 mg/dL (ref 0.44–1.00)
GFR, Estimated: >60 mL/min (ref >60)
Glucose, Bld: 91 mg/dL (ref 70–99)
Potassium: 3.7 mmol/L (ref 3.5–5.1)
Sodium: 137 mmol/L (ref 135–145)
Total Bilirubin: 0.5 mg/dL (ref <1.2)
Total Protein: 7 g/dL (ref 6.5–8.1)

## 2023-07-02 MED ORDER — FENTANYL CITRATE PF 50 MCG/ML IJ SOSY
50.0000 ug | PREFILLED_SYRINGE | Freq: Once | INTRAMUSCULAR | Status: AC
  Administered 2023-07-02: 50 ug via INTRAVENOUS
  Filled 2023-07-02: qty 1

## 2023-07-02 MED ORDER — NAPROXEN 375 MG PO TABS: 375.0000 mg | ORAL_TABLET | Freq: Two times a day (BID) | ORAL | 0 refills | Status: AC
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
## 2023-07-02 NOTE — ED Notes (Signed)
X-ray present
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
## 2023-07-02 NOTE — ED Notes (Signed)
Did manual blood pressure

## 2023-07-02 NOTE — ED Notes (Signed)
Help get patient undressed into a gown on the monitor patient is resting with call bell in reach  
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
## 2023-07-02 NOTE — ED Notes (Signed)
Back from CT transported by nurse

## 2023-07-02 NOTE — ED Notes (Signed)
Patient transported to CT with nurse
